# Patient Record
Sex: Female | Born: 1960 | Race: White | Hispanic: No | Marital: Married | Smoking: Never smoker
Health system: Southern US, Community
[De-identification: ages and names within clinical notes are randomized; demographics above are authoritative.]

## PROBLEM LIST (undated history)

## (undated) DIAGNOSIS — Z9289 Personal history of other medical treatment: Secondary | ICD-10-CM

## (undated) DIAGNOSIS — N179 Acute kidney failure, unspecified: Secondary | ICD-10-CM

## (undated) DIAGNOSIS — M14679 Charcot's joint, unspecified ankle and foot: Secondary | ICD-10-CM

## (undated) DIAGNOSIS — F329 Major depressive disorder, single episode, unspecified: Secondary | ICD-10-CM

## (undated) DIAGNOSIS — F32A Depression, unspecified: Secondary | ICD-10-CM

## (undated) DIAGNOSIS — K219 Gastro-esophageal reflux disease without esophagitis: Secondary | ICD-10-CM

## (undated) DIAGNOSIS — I10 Essential (primary) hypertension: Secondary | ICD-10-CM

## (undated) DIAGNOSIS — R0602 Shortness of breath: Secondary | ICD-10-CM

## (undated) DIAGNOSIS — M797 Fibromyalgia: Secondary | ICD-10-CM

## (undated) DIAGNOSIS — J189 Pneumonia, unspecified organism: Secondary | ICD-10-CM

## (undated) DIAGNOSIS — E119 Type 2 diabetes mellitus without complications: Secondary | ICD-10-CM

## (undated) DIAGNOSIS — Z8614 Personal history of Methicillin resistant Staphylococcus aureus infection: Secondary | ICD-10-CM

---

## 1996-07-16 HISTORY — PX: TUBAL LIGATION: SHX77

## 2008-07-16 HISTORY — PX: TOE AMPUTATION: SHX809

## 2010-07-16 DIAGNOSIS — J189 Pneumonia, unspecified organism: Secondary | ICD-10-CM

## 2010-07-16 DIAGNOSIS — Z9289 Personal history of other medical treatment: Secondary | ICD-10-CM

## 2010-07-16 DIAGNOSIS — N179 Acute kidney failure, unspecified: Secondary | ICD-10-CM

## 2010-07-16 HISTORY — PX: BELOW KNEE LEG AMPUTATION: SUR23

## 2010-07-16 HISTORY — DX: Pneumonia, unspecified organism: J18.9

## 2010-07-16 HISTORY — DX: Personal history of other medical treatment: Z92.89

## 2010-07-16 HISTORY — PX: CARDIAC CATHETERIZATION: SHX172

## 2010-07-16 HISTORY — DX: Acute kidney failure, unspecified: N17.9

## 2013-06-17 ENCOUNTER — Encounter (INDEPENDENT_AMBULATORY_CARE_PROVIDER_SITE_OTHER): Payer: Medicare Other | Admitting: Ophthalmology

## 2013-06-17 DIAGNOSIS — E11359 Type 2 diabetes mellitus with proliferative diabetic retinopathy without macular edema: Secondary | ICD-10-CM

## 2013-06-17 DIAGNOSIS — H35039 Hypertensive retinopathy, unspecified eye: Secondary | ICD-10-CM

## 2013-06-17 DIAGNOSIS — I10 Essential (primary) hypertension: Secondary | ICD-10-CM

## 2013-06-17 DIAGNOSIS — E1039 Type 1 diabetes mellitus with other diabetic ophthalmic complication: Secondary | ICD-10-CM

## 2013-06-17 DIAGNOSIS — H2510 Age-related nuclear cataract, unspecified eye: Secondary | ICD-10-CM

## 2013-06-17 DIAGNOSIS — H43819 Vitreous degeneration, unspecified eye: Secondary | ICD-10-CM

## 2013-06-17 DIAGNOSIS — H4310 Vitreous hemorrhage, unspecified eye: Secondary | ICD-10-CM

## 2013-06-18 DIAGNOSIS — E113519 Type 2 diabetes mellitus with proliferative diabetic retinopathy with macular edema, unspecified eye: Secondary | ICD-10-CM | POA: Diagnosis present

## 2013-06-18 DIAGNOSIS — H3342 Traction detachment of retina, left eye: Secondary | ICD-10-CM | POA: Diagnosis present

## 2013-06-18 DIAGNOSIS — H35059 Retinal neovascularization, unspecified, unspecified eye: Secondary | ICD-10-CM | POA: Diagnosis present

## 2013-06-18 NOTE — H&P (Signed)
Danielle Ingram is an 52 y.o. female.   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  No past surgical history on file.  No family history on file. Social History:  has no tobacco, alcohol, and drug history on file.  Allergies: Allergies not on file  No prescriptions prior to admission    Review of systems otherwise negative  There were no vitals taken for this visit.  Physical exam: Mental status: oriented x3. Eyes: See eye exam associated with this date of surgery in media tab.  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

## 2013-06-25 ENCOUNTER — Encounter (HOSPITAL_COMMUNITY): Payer: Self-pay | Admitting: Pharmacy Technician

## 2013-06-29 ENCOUNTER — Encounter (HOSPITAL_COMMUNITY): Payer: Self-pay | Admitting: *Deleted

## 2013-06-29 MED ORDER — CEFAZOLIN SODIUM-DEXTROSE 2-3 GM-% IV SOLR
2.0000 g | INTRAVENOUS | Status: AC
  Administered 2013-06-30: 2 g via INTRAVENOUS
  Filled 2013-06-29: qty 50

## 2013-06-29 NOTE — Progress Notes (Signed)
Patient reported that she had An EKG done at [facility] in [place]. 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  I sent a request to the hospital and her Medical MD.  Pt states that she does not see a cardiologist.

## 2013-06-30 ENCOUNTER — Encounter (HOSPITAL_COMMUNITY)
Admission: RE | Disposition: A | Discharge status: Home / Self Care | Payer: Self-pay | Source: Ambulatory Visit | Attending: Ophthalmology

## 2013-06-30 ENCOUNTER — Ambulatory Visit (HOSPITAL_COMMUNITY): Payer: Medicare Other | Admitting: Anesthesiology

## 2013-06-30 ENCOUNTER — Encounter (HOSPITAL_COMMUNITY): Payer: Self-pay | Admitting: *Deleted

## 2013-06-30 ENCOUNTER — Ambulatory Visit (HOSPITAL_COMMUNITY)
Admission: RE | Admit: 2013-06-30 | Discharge: 2013-07-01 | Disposition: A | Discharge status: Home / Self Care | Payer: Medicare Other | Source: Ambulatory Visit | Attending: Ophthalmology | Admitting: Ophthalmology

## 2013-06-30 ENCOUNTER — Ambulatory Visit (HOSPITAL_COMMUNITY): Payer: Medicare Other

## 2013-06-30 ENCOUNTER — Encounter (HOSPITAL_COMMUNITY): Payer: Medicare Other | Admitting: Anesthesiology

## 2013-06-30 DIAGNOSIS — F3289 Other specified depressive episodes: Secondary | ICD-10-CM | POA: Insufficient documentation

## 2013-06-30 DIAGNOSIS — E113519 Type 2 diabetes mellitus with proliferative diabetic retinopathy with macular edema, unspecified eye: Secondary | ICD-10-CM | POA: Diagnosis present

## 2013-06-30 DIAGNOSIS — H3342 Traction detachment of retina, left eye: Secondary | ICD-10-CM | POA: Diagnosis present

## 2013-06-30 DIAGNOSIS — I10 Essential (primary) hypertension: Secondary | ICD-10-CM | POA: Insufficient documentation

## 2013-06-30 DIAGNOSIS — E1139 Type 2 diabetes mellitus with other diabetic ophthalmic complication: Secondary | ICD-10-CM | POA: Insufficient documentation

## 2013-06-30 DIAGNOSIS — H269 Unspecified cataract: Secondary | ICD-10-CM | POA: Insufficient documentation

## 2013-06-30 DIAGNOSIS — K219 Gastro-esophageal reflux disease without esophagitis: Secondary | ICD-10-CM | POA: Insufficient documentation

## 2013-06-30 DIAGNOSIS — R0602 Shortness of breath: Secondary | ICD-10-CM | POA: Insufficient documentation

## 2013-06-30 DIAGNOSIS — N289 Disorder of kidney and ureter, unspecified: Secondary | ICD-10-CM | POA: Insufficient documentation

## 2013-06-30 DIAGNOSIS — E11359 Type 2 diabetes mellitus with proliferative diabetic retinopathy without macular edema: Secondary | ICD-10-CM | POA: Insufficient documentation

## 2013-06-30 DIAGNOSIS — H4310 Vitreous hemorrhage, unspecified eye: Secondary | ICD-10-CM | POA: Insufficient documentation

## 2013-06-30 DIAGNOSIS — F329 Major depressive disorder, single episode, unspecified: Secondary | ICD-10-CM | POA: Insufficient documentation

## 2013-06-30 DIAGNOSIS — E1039 Type 1 diabetes mellitus with other diabetic ophthalmic complication: Secondary | ICD-10-CM

## 2013-06-30 DIAGNOSIS — H3340 Traction detachment of retina, unspecified eye: Secondary | ICD-10-CM

## 2013-06-30 DIAGNOSIS — E1065 Type 1 diabetes mellitus with hyperglycemia: Secondary | ICD-10-CM

## 2013-06-30 DIAGNOSIS — Z794 Long term (current) use of insulin: Secondary | ICD-10-CM | POA: Insufficient documentation

## 2013-06-30 DIAGNOSIS — H35059 Retinal neovascularization, unspecified, unspecified eye: Secondary | ICD-10-CM | POA: Diagnosis present

## 2013-06-30 HISTORY — DX: Depression, unspecified: F32.A

## 2013-06-30 HISTORY — DX: Type 2 diabetes mellitus without complications: E11.9

## 2013-06-30 HISTORY — PX: MEMBRANE PEEL: SHX5967

## 2013-06-30 HISTORY — DX: Pneumonia, unspecified organism: J18.9

## 2013-06-30 HISTORY — DX: Essential (primary) hypertension: I10

## 2013-06-30 HISTORY — DX: Major depressive disorder, single episode, unspecified: F32.9

## 2013-06-30 HISTORY — DX: Gastro-esophageal reflux disease without esophagitis: K21.9

## 2013-06-30 HISTORY — DX: Personal history of other medical treatment: Z92.89

## 2013-06-30 HISTORY — PX: GAS/FLUID EXCHANGE: SHX5334

## 2013-06-30 HISTORY — PX: PHOTOCOAGULATION WITH LASER: SHX6027

## 2013-06-30 HISTORY — DX: Charcot's joint, unspecified ankle and foot: M14.679

## 2013-06-30 HISTORY — DX: Acute kidney failure, unspecified: N17.9

## 2013-06-30 HISTORY — PX: PARS PLANA VITRECTOMY: SHX2166

## 2013-06-30 HISTORY — DX: Fibromyalgia: M79.7

## 2013-06-30 HISTORY — DX: Shortness of breath: R06.02

## 2013-06-30 LAB — GLUCOSE, CAPILLARY
Glucose-Capillary: 220 mg/dL — ABNORMAL HIGH (ref 70–99)
Glucose-Capillary: 240 mg/dL — ABNORMAL HIGH (ref 70–99)
Glucose-Capillary: 263 mg/dL — ABNORMAL HIGH (ref 70–99)
Glucose-Capillary: 271 mg/dL — ABNORMAL HIGH (ref 70–99)
Glucose-Capillary: 372 mg/dL — ABNORMAL HIGH (ref 70–99)
Glucose-Capillary: 404 mg/dL — ABNORMAL HIGH (ref 70–99)

## 2013-06-30 LAB — HCG, SERUM, QUALITATIVE: Preg, Serum: NEGATIVE

## 2013-06-30 LAB — CBC
Hemoglobin: 13.4 g/dL (ref 12.0–15.0)
MCH: 26.1 pg (ref 26.0–34.0)
MCHC: 31.5 g/dL (ref 30.0–36.0)
Platelets: 310 10*3/uL (ref 150–400)
RDW: 14.6 % (ref 11.5–15.5)

## 2013-06-30 LAB — BASIC METABOLIC PANEL
BUN: 16 mg/dL (ref 6–23)
Calcium: 9.9 mg/dL (ref 8.4–10.5)
Creatinine, Ser: 0.96 mg/dL (ref 0.50–1.10)
GFR calc Af Amer: 79 mL/min — ABNORMAL LOW (ref >90)
GFR calc non Af Amer: 68 mL/min — ABNORMAL LOW (ref >90)
Glucose, Bld: 304 mg/dL — ABNORMAL HIGH (ref 70–99)

## 2013-06-30 SURGERY — PARS PLANA VITRECTOMY WITH 25 GAUGE
Anesthesia: General | Site: Eye | Laterality: Left

## 2013-06-30 MED ORDER — ACETAZOLAMIDE SODIUM 500 MG IJ SOLR
INTRAMUSCULAR | Status: AC
  Filled 2013-06-30: qty 500

## 2013-06-30 MED ORDER — ONDANSETRON HCL 4 MG/2ML IJ SOLN
INTRAMUSCULAR | Status: DC | PRN
  Administered 2013-06-30: 4 mg via INTRAVENOUS

## 2013-06-30 MED ORDER — MIDAZOLAM HCL 5 MG/5ML IJ SOLN
INTRAMUSCULAR | Status: DC | PRN
  Administered 2013-06-30: 2 mg via INTRAVENOUS

## 2013-06-30 MED ORDER — FENTANYL CITRATE 0.05 MG/ML IJ SOLN
INTRAMUSCULAR | Status: DC | PRN
  Administered 2013-06-30: 100 ug via INTRAVENOUS
  Administered 2013-06-30: 50 ug via INTRAVENOUS

## 2013-06-30 MED ORDER — TRIAMCINOLONE ACETONIDE 40 MG/ML IJ SUSP
INTRAMUSCULAR | Status: AC
  Filled 2013-06-30: qty 5

## 2013-06-30 MED ORDER — HYDROMORPHONE HCL PF 1 MG/ML IJ SOLN
INTRAMUSCULAR | Status: AC
  Filled 2013-06-30: qty 1

## 2013-06-30 MED ORDER — CYCLOPENTOLATE HCL 1 % OP SOLN
1.0000 [drp] | OPHTHALMIC | Status: AC | PRN
  Administered 2013-06-30 (×3): 1 [drp] via OPHTHALMIC
  Filled 2013-06-30: qty 2

## 2013-06-30 MED ORDER — SODIUM CHLORIDE 0.45 % IV SOLN
INTRAVENOUS | Status: DC
  Administered 2013-06-30: 20 mL/h via INTRAVENOUS

## 2013-06-30 MED ORDER — HYDROMORPHONE HCL PF 1 MG/ML IJ SOLN
0.2500 mg | INTRAMUSCULAR | Status: DC | PRN
  Administered 2013-06-30: 0.25 mg via INTRAVENOUS

## 2013-06-30 MED ORDER — FLUCONAZOLE 100 MG PO TABS
100.0000 mg | ORAL_TABLET | Freq: Every day | ORAL | Status: DC
  Administered 2013-06-30: 100 mg via ORAL
  Filled 2013-06-30 (×5): qty 1

## 2013-06-30 MED ORDER — TROPICAMIDE 1 % OP SOLN
OPHTHALMIC | Status: AC
  Administered 2013-06-30: 1 [drp]
  Filled 2013-06-30: qty 3

## 2013-06-30 MED ORDER — TRAZODONE HCL 100 MG PO TABS
100.0000 mg | ORAL_TABLET | Freq: Every day | ORAL | Status: DC
  Administered 2013-06-30: 100 mg via ORAL
  Filled 2013-06-30 (×3): qty 1

## 2013-06-30 MED ORDER — DIFLUPREDNATE 0.05 % OP EMUL: 1.0000 [drp] | Freq: Every day | OPHTHALMIC | Status: DC

## 2013-06-30 MED ORDER — DEXAMETHASONE SODIUM PHOSPHATE 10 MG/ML IJ SOLN
INTRAMUSCULAR | Status: DC | PRN
  Administered 2013-06-30: 10 mg

## 2013-06-30 MED ORDER — MORPHINE SULFATE 2 MG/ML IJ SOLN: 1.0000 mg | INTRAMUSCULAR | Status: DC | PRN

## 2013-06-30 MED ORDER — NEOSTIGMINE METHYLSULFATE 1 MG/ML IJ SOLN
INTRAMUSCULAR | Status: DC | PRN
  Administered 2013-06-30: 3 mg via INTRAVENOUS

## 2013-06-30 MED ORDER — HYDROCHLOROTHIAZIDE 12.5 MG PO CAPS
12.5000 mg | ORAL_CAPSULE | Freq: Every day | ORAL | Status: DC
  Administered 2013-06-30: 12.5 mg via ORAL
  Filled 2013-06-30 (×2): qty 1

## 2013-06-30 MED ORDER — ATROPINE SULFATE 1 % OP SOLN
OPHTHALMIC | Status: AC
  Filled 2013-06-30: qty 2

## 2013-06-30 MED ORDER — HYPROMELLOSE (GONIOSCOPIC) 2.5 % OP SOLN
OPHTHALMIC | Status: AC
  Filled 2013-06-30: qty 15

## 2013-06-30 MED ORDER — ATROPINE SULFATE 1 % OP SOLN
OPHTHALMIC | Status: DC | PRN
  Administered 2013-06-30: 2 [drp] via OPHTHALMIC

## 2013-06-30 MED ORDER — TEMAZEPAM 15 MG PO CAPS: 15.0000 mg | ORAL_CAPSULE | Freq: Every evening | ORAL | Status: DC | PRN

## 2013-06-30 MED ORDER — ACETAZOLAMIDE SODIUM 500 MG IJ SOLR
500.0000 mg | Freq: Once | INTRAMUSCULAR | Status: AC
  Administered 2013-07-01: 500 mg via INTRAVENOUS
  Filled 2013-06-30: qty 500

## 2013-06-30 MED ORDER — FENTANYL 50 MCG/HR TD PT72
150.0000 ug | MEDICATED_PATCH | TRANSDERMAL | Status: DC
  Administered 2013-06-30: 150 ug via TRANSDERMAL
  Filled 2013-06-30: qty 3

## 2013-06-30 MED ORDER — LISINOPRIL-HYDROCHLOROTHIAZIDE 10-12.5 MG PO TABS: 1.0000 | ORAL_TABLET | Freq: Every day | ORAL | Status: DC

## 2013-06-30 MED ORDER — ONDANSETRON HCL 4 MG/2ML IJ SOLN: 4.0000 mg | Freq: Four times a day (QID) | INTRAMUSCULAR | Status: DC | PRN

## 2013-06-30 MED ORDER — PHENYLEPHRINE HCL 10 MG/ML IJ SOLN
INTRAMUSCULAR | Status: DC | PRN
  Administered 2013-06-30: 40 ug via INTRAVENOUS

## 2013-06-30 MED ORDER — LISINOPRIL 10 MG PO TABS
10.0000 mg | ORAL_TABLET | Freq: Every day | ORAL | Status: DC
  Administered 2013-06-30: 10 mg via ORAL
  Filled 2013-06-30 (×2): qty 1

## 2013-06-30 MED ORDER — HYDROCODONE-ACETAMINOPHEN 5-325 MG PO TABS
1.0000 | ORAL_TABLET | ORAL | Status: DC | PRN
  Administered 2013-06-30 – 2013-07-01 (×2): 2 via ORAL
  Filled 2013-06-30 (×2): qty 2

## 2013-06-30 MED ORDER — GLYCOPYRROLATE 0.2 MG/ML IJ SOLN
INTRAMUSCULAR | Status: DC | PRN
  Administered 2013-06-30: 0.4 mg via INTRAVENOUS

## 2013-06-30 MED ORDER — GATIFLOXACIN 0.5 % OP SOLN
1.0000 [drp] | Freq: Four times a day (QID) | OPHTHALMIC | Status: DC
  Filled 2013-06-30: qty 2.5

## 2013-06-30 MED ORDER — MINERAL OIL LIGHT 100 % EX OIL
TOPICAL_OIL | CUTANEOUS | Status: AC
  Filled 2013-06-30: qty 25

## 2013-06-30 MED ORDER — LATANOPROST 0.005 % OP SOLN
1.0000 [drp] | Freq: Every day | OPHTHALMIC | Status: DC
  Filled 2013-06-30: qty 2.5

## 2013-06-30 MED ORDER — EPINEPHRINE HCL 1 MG/ML IJ SOLN
INTRAMUSCULAR | Status: AC
  Filled 2013-06-30: qty 1

## 2013-06-30 MED ORDER — EPINEPHRINE HCL 1 MG/ML IJ SOLN
INTRAOCULAR | Status: DC | PRN
  Administered 2013-06-30: 11:00:00

## 2013-06-30 MED ORDER — ACETAMINOPHEN 325 MG PO TABS: 325.0000 mg | ORAL_TABLET | ORAL | Status: DC | PRN

## 2013-06-30 MED ORDER — LIDOCAINE HCL (CARDIAC) 20 MG/ML IV SOLN
INTRAVENOUS | Status: DC | PRN
  Administered 2013-06-30: 60 mg via INTRAVENOUS

## 2013-06-30 MED ORDER — SODIUM CHLORIDE 0.9 % IJ SOLN
INTRAMUSCULAR | Status: DC | PRN
  Administered 2013-06-30: 11:00:00

## 2013-06-30 MED ORDER — SODIUM CHLORIDE 0.9 % IJ SOLN
INTRAMUSCULAR | Status: AC
  Filled 2013-06-30: qty 10

## 2013-06-30 MED ORDER — GENTAMICIN SULFATE 40 MG/ML IJ SOLN
INTRAMUSCULAR | Status: AC
  Filled 2013-06-30: qty 2

## 2013-06-30 MED ORDER — BUPIVACAINE HCL (PF) 0.75 % IJ SOLN
INTRAMUSCULAR | Status: DC | PRN
  Administered 2013-06-30: 10 mL

## 2013-06-30 MED ORDER — BUPIVACAINE-EPINEPHRINE (PF) 0.25% -1:200000 IJ SOLN
INTRAMUSCULAR | Status: AC
  Filled 2013-06-30: qty 30

## 2013-06-30 MED ORDER — SODIUM CHLORIDE 0.9 % IV SOLN
INTRAVENOUS | Status: DC
  Administered 2013-06-30 (×2): via INTRAVENOUS

## 2013-06-30 MED ORDER — LIDOCAINE HCL 2 % IJ SOLN
INTRAMUSCULAR | Status: AC
  Filled 2013-06-30: qty 20

## 2013-06-30 MED ORDER — BRIMONIDINE TARTRATE 0.2 % OP SOLN
1.0000 [drp] | Freq: Two times a day (BID) | OPHTHALMIC | Status: DC
  Filled 2013-06-30: qty 5

## 2013-06-30 MED ORDER — HEMOSTATIC AGENTS (NO CHARGE) OPTIME
TOPICAL | Status: DC | PRN
  Administered 2013-06-30: 1 via TOPICAL

## 2013-06-30 MED ORDER — MAGNESIUM HYDROXIDE 400 MG/5ML PO SUSP: 15.0000 mL | Freq: Four times a day (QID) | ORAL | Status: DC | PRN

## 2013-06-30 MED ORDER — BACITRACIN-POLYMYXIN B 500-10000 UNIT/GM OP OINT
TOPICAL_OINTMENT | OPHTHALMIC | Status: DC | PRN
  Administered 2013-06-30: 1 via OPHTHALMIC

## 2013-06-30 MED ORDER — ROCURONIUM BROMIDE 100 MG/10ML IV SOLN
INTRAVENOUS | Status: DC | PRN
  Administered 2013-06-30: 35 mg via INTRAVENOUS

## 2013-06-30 MED ORDER — DOCUSATE SODIUM 100 MG PO CAPS
100.0000 mg | ORAL_CAPSULE | Freq: Two times a day (BID) | ORAL | Status: DC
  Administered 2013-06-30: 100 mg via ORAL
  Filled 2013-06-30: qty 1

## 2013-06-30 MED ORDER — HYALURONIDASE HUMAN 150 UNIT/ML IJ SOLN
INTRAMUSCULAR | Status: AC
  Filled 2013-06-30: qty 1

## 2013-06-30 MED ORDER — PREDNISOLONE ACETATE 1 % OP SUSP
1.0000 [drp] | Freq: Four times a day (QID) | OPHTHALMIC | Status: DC
  Filled 2013-06-30: qty 1

## 2013-06-30 MED ORDER — BACITRACIN-POLYMYXIN B 500-10000 UNIT/GM OP OINT
1.0000 "application " | TOPICAL_OINTMENT | Freq: Four times a day (QID) | OPHTHALMIC | Status: DC
  Filled 2013-06-30: qty 3.5

## 2013-06-30 MED ORDER — INSULIN DETEMIR 100 UNIT/ML ~~LOC~~ SOLN
45.0000 [IU] | Freq: Every day | SUBCUTANEOUS | Status: DC
  Administered 2013-06-30: 45 [IU] via SUBCUTANEOUS
  Filled 2013-06-30: qty 0.45

## 2013-06-30 MED ORDER — PROPOFOL 10 MG/ML IV BOLUS
INTRAVENOUS | Status: DC | PRN
  Administered 2013-06-30: 180 mg via INTRAVENOUS

## 2013-06-30 MED ORDER — GATIFLOXACIN 0.5 % OP SOLN
1.0000 [drp] | OPHTHALMIC | Status: AC | PRN
  Administered 2013-06-30 (×3): 1 [drp] via OPHTHALMIC
  Filled 2013-06-30: qty 2.5

## 2013-06-30 MED ORDER — PANTOPRAZOLE SODIUM 40 MG PO TBEC
40.0000 mg | DELAYED_RELEASE_TABLET | Freq: Every day | ORAL | Status: DC
  Administered 2013-06-30: 40 mg via ORAL
  Filled 2013-06-30: qty 1

## 2013-06-30 MED ORDER — BSS IO SOLN
INTRAOCULAR | Status: AC
  Filled 2013-06-30: qty 15

## 2013-06-30 MED ORDER — INSULIN ASPART 100 UNIT/ML ~~LOC~~ SOLN
12.0000 [IU] | Freq: Three times a day (TID) | SUBCUTANEOUS | Status: DC
  Administered 2013-06-30: 12 [IU] via SUBCUTANEOUS

## 2013-06-30 MED ORDER — TROPICAMIDE 1 % OP SOLN
1.0000 [drp] | OPHTHALMIC | Status: AC | PRN
  Administered 2013-06-30 (×3): 1 [drp] via OPHTHALMIC

## 2013-06-30 MED ORDER — BSS PLUS IO SOLN
INTRAOCULAR | Status: AC
  Filled 2013-06-30: qty 500

## 2013-06-30 MED ORDER — SODIUM HYALURONATE 10 MG/ML IO SOLN
INTRAOCULAR | Status: AC
  Filled 2013-06-30: qty 0.85

## 2013-06-30 MED ORDER — BUPIVACAINE HCL (PF) 0.75 % IJ SOLN
INTRAMUSCULAR | Status: AC
  Filled 2013-06-30: qty 10

## 2013-06-30 MED ORDER — SODIUM HYALURONATE 10 MG/ML IO SOLN
INTRAOCULAR | Status: DC | PRN
  Administered 2013-06-30: 0.85 mL via INTRAOCULAR

## 2013-06-30 MED ORDER — BACITRACIN-POLYMYXIN B 500-10000 UNIT/GM OP OINT
TOPICAL_OINTMENT | OPHTHALMIC | Status: AC
  Filled 2013-06-30: qty 3.5

## 2013-06-30 MED ORDER — GABAPENTIN 300 MG PO CAPS
900.0000 mg | ORAL_CAPSULE | Freq: Three times a day (TID) | ORAL | Status: DC
  Administered 2013-06-30 (×2): 900 mg via ORAL
  Filled 2013-06-30 (×5): qty 3

## 2013-06-30 MED ORDER — PHENYLEPHRINE HCL 2.5 % OP SOLN
1.0000 [drp] | OPHTHALMIC | Status: AC | PRN
  Administered 2013-06-30 (×3): 1 [drp] via OPHTHALMIC
  Filled 2013-06-30: qty 15

## 2013-06-30 MED ORDER — TETRACAINE HCL 0.5 % OP SOLN
2.0000 [drp] | Freq: Once | OPHTHALMIC | Status: DC
  Filled 2013-06-30: qty 2

## 2013-06-30 MED ORDER — DEXAMETHASONE SODIUM PHOSPHATE 10 MG/ML IJ SOLN
INTRAMUSCULAR | Status: AC
  Filled 2013-06-30: qty 1

## 2013-06-30 MED ORDER — POLYMYXIN B SULFATE 500000 UNITS IJ SOLR
INTRAMUSCULAR | Status: AC
  Filled 2013-06-30: qty 1

## 2013-06-30 SURGICAL SUPPLY — 67 items
APPLICATOR DR MATTHEWS STRL (MISCELLANEOUS) IMPLANT
BLADE EYE CATARACT 19 1.4 BEAV (BLADE) IMPLANT
BLADE MVR KNIFE 19G (BLADE) IMPLANT
BLADE MVR KNIFE 20G (BLADE) IMPLANT
CANNULA DUAL BORE 23G (CANNULA) IMPLANT
CANNULA VLV SOFT TIP 25GA (OPHTHALMIC) ×2 IMPLANT
CORDS BIPOLAR (ELECTRODE) ×2 IMPLANT
COTTONBALL LRG STERILE PKG (GAUZE/BANDAGES/DRESSINGS) ×6 IMPLANT
COVER MAYO STAND STRL (DRAPES) ×2 IMPLANT
DRAPE INCISE 51X51 W/FILM STRL (DRAPES) ×2 IMPLANT
DRAPE OPHTHALMIC 77X100 STRL (CUSTOM PROCEDURE TRAY) ×2 IMPLANT
FILTER BLUE MILLIPORE (MISCELLANEOUS) IMPLANT
FORCEPS ECKARDT ILM 25G SERR (OPHTHALMIC RELATED) IMPLANT
FORCEPS GRIESHABER ILM 25G A (INSTRUMENTS) ×2 IMPLANT
GLOVE BIOGEL PI IND STRL 6.5 (GLOVE) ×1 IMPLANT
GLOVE BIOGEL PI IND STRL 7.0 (GLOVE) ×1 IMPLANT
GLOVE BIOGEL PI INDICATOR 6.5 (GLOVE) ×1
GLOVE BIOGEL PI INDICATOR 7.0 (GLOVE) ×1
GLOVE SS BIOGEL STRL SZ 6.5 (GLOVE) ×1 IMPLANT
GLOVE SS BIOGEL STRL SZ 7 (GLOVE) ×1 IMPLANT
GLOVE SUPERSENSE BIOGEL SZ 6.5 (GLOVE) ×1
GLOVE SUPERSENSE BIOGEL SZ 7 (GLOVE) ×1
GLOVE SURG 8.5 LATEX PF (GLOVE) ×2 IMPLANT
GLOVE SURG SS PI 6.5 STRL IVOR (GLOVE) ×2 IMPLANT
GOWN STRL NON-REIN LRG LVL3 (GOWN DISPOSABLE) ×8 IMPLANT
HANDLE PNEUMATIC FOR CONSTEL (OPHTHALMIC) ×2 IMPLANT
KIT BASIN OR (CUSTOM PROCEDURE TRAY) ×2 IMPLANT
KNIFE CRESCENT 2.5 55 ANG (BLADE) IMPLANT
MASK EYE SHIELD (GAUZE/BANDAGES/DRESSINGS) ×2 IMPLANT
MICROPICK 25G (MISCELLANEOUS)
NEEDLE 18GX1X1/2 (RX/OR ONLY) (NEEDLE) ×2 IMPLANT
NEEDLE 25GX 5/8IN NON SAFETY (NEEDLE) ×2 IMPLANT
NEEDLE 27GAX1X1/2 (NEEDLE) IMPLANT
NEEDLE FILTER BLUNT 18X 1/2SAF (NEEDLE) ×1
NEEDLE FILTER BLUNT 18X1 1/2 (NEEDLE) ×1 IMPLANT
NEEDLE HYPO 30X.5 LL (NEEDLE) ×4 IMPLANT
NS IRRIG 1000ML POUR BTL (IV SOLUTION) ×2 IMPLANT
PACK VITRECTOMY CUSTOM (CUSTOM PROCEDURE TRAY) ×2 IMPLANT
PAD ARMBOARD 7.5X6 YLW CONV (MISCELLANEOUS) ×4 IMPLANT
PAD EYE OVAL STERILE LF (GAUZE/BANDAGES/DRESSINGS) ×2 IMPLANT
PAK PIK VITRECTOMY CVS 25GA (OPHTHALMIC) ×2 IMPLANT
PENCIL BIPOLAR 25GA STR DISP (OPHTHALMIC RELATED) ×2 IMPLANT
PIC ILLUMINATED 25G (OPHTHALMIC) ×2
PICK MICROPICK 25G (MISCELLANEOUS) IMPLANT
PIK ILLUMINATED 25G (OPHTHALMIC) ×1 IMPLANT
PROBE LASER ILLUM FLEX CVD 25G (OPHTHALMIC) ×2 IMPLANT
REPL STRA BRUSH NEEDLE (NEEDLE) IMPLANT
RESERVOIR BACK FLUSH (MISCELLANEOUS) IMPLANT
ROLLS DENTAL (MISCELLANEOUS) ×4 IMPLANT
SCISSORS TIP ADVANCED DSP 25GA (INSTRUMENTS) IMPLANT
SCRAPER DIAMOND DUST MEMBRANE (MISCELLANEOUS) IMPLANT
SPONGE SURGIFOAM ABS GEL 12-7 (HEMOSTASIS) ×2 IMPLANT
STOPCOCK 4 WAY LG BORE MALE ST (IV SETS) IMPLANT
SUT CHROMIC 7 0 TG140 8 (SUTURE) IMPLANT
SUT ETHILON 10 0 CS140 6 (SUTURE) IMPLANT
SUT ETHILON 9 0 TG140 8 (SUTURE) IMPLANT
SUT POLY NON ABSORB 10-0 8 STR (SUTURE) IMPLANT
SYR 20CC LL (SYRINGE) ×2 IMPLANT
SYR 5ML LL (SYRINGE) IMPLANT
SYR BULB 3OZ (MISCELLANEOUS) ×2 IMPLANT
SYR TB 1ML LUER SLIP (SYRINGE) ×2 IMPLANT
TAPE SURG TRANSPORE 1 IN (GAUZE/BANDAGES/DRESSINGS) ×1 IMPLANT
TAPE SURGICAL TRANSPORE 1 IN (GAUZE/BANDAGES/DRESSINGS) ×1
TOWEL OR 17X24 6PK STRL BLUE (TOWEL DISPOSABLE) IMPLANT
TOWEL OR 17X26 10 PK STRL BLUE (TOWEL DISPOSABLE) ×2 IMPLANT
WATER STERILE IRR 1000ML POUR (IV SOLUTION) ×2 IMPLANT
WIPE INSTRUMENT VISIWIPE 73X73 (MISCELLANEOUS) ×2 IMPLANT

## 2013-06-30 NOTE — H&P (Signed)
I examined the patient today and there is no change in the medical status 

## 2013-06-30 NOTE — Anesthesia Procedure Notes (Signed)
Procedure Name: Intubation Date/Time: 06/30/2013 11:40 AM Performed by: Lovie Chol Pre-anesthesia Checklist: Patient identified, Emergency Drugs available, Suction available, Patient being monitored and Timeout performed Patient Re-evaluated:Patient Re-evaluated prior to inductionOxygen Delivery Method: Circle system utilized Preoxygenation: Pre-oxygenation with 100% oxygen Intubation Type: IV induction Ventilation: Mask ventilation without difficulty Laryngoscope Size: Miller and 2 Grade View: Grade I Tube type: Oral Tube size: 7.0 mm Number of attempts: 1 Airway Equipment and Method: Stylet Placement Confirmation: ETT inserted through vocal cords under direct vision,  positive ETCO2,  CO2 detector and breath sounds checked- equal and bilateral Secured at: 22 cm Tube secured with: Tape Dental Injury: Teeth and Oropharynx as per pre-operative assessment

## 2013-06-30 NOTE — Brief Op Note (Signed)
Brief Operative note   Preoperative diagnosis:  Pre-Op Diagnosis Codes:    * Vitreous hemorrhage, left [379.23] Postoperative diagnosis  Post-Op Diagnosis Codes:    * Vitreous hemorrhage, left [379.23] Traction retinal detachment left eye  Procedures: Repair of complex traction retinal detachment with vitrectomy, membrane peel, laser and gas injection left eye.  Surgeon:  Sherrie George, MD...  Assistant:  Rosalie Doctor SA    Anesthesia: General  Specimen: none  Estimated blood loss:  1cc  Complications: none  Patient sent to PACU in good condition  Composed by Sherrie George MD  Dictation number: [phone]

## 2013-06-30 NOTE — Anesthesia Postprocedure Evaluation (Signed)
  Anesthesia Post-op Note  Patient: Danielle Ingram  Procedure(s) Performed: Procedure(s) with comments: PARS PLANA VITRECTOMY WITH 25 GAUGE (Left) - COMPLEX REPAIR OF TRACTION RETINAL DETACHMENT MEMBRANE PEEL (Left) ENDOLASER (Left) AIR/FLUID EXCHANGE (Left)  Patient Location: PACU  Anesthesia Type:General  Level of Consciousness: awake  Airway and Oxygen Therapy: Patient Spontanous Breathing  Post-op Pain: mild  Post-op Assessment: Post-op Vital signs reviewed  Post-op Vital Signs: Reviewed  Complications: No apparent anesthesia complications 

## 2013-06-30 NOTE — Transfer of Care (Signed)
Immediate Anesthesia Transfer of Care Note  Patient: Danielle Ingram  Procedure(s) Performed: Procedure(s) with comments: PARS PLANA VITRECTOMY WITH 25 GAUGE (Left) - COMPLEX REPAIR OF TRACTION RETINAL DETACHMENT MEMBRANE PEEL (Left) ENDOLASER (Left) AIR/FLUID EXCHANGE (Left)  Patient Location: PACU  Anesthesia Type:General  Level of Consciousness: awake, oriented and patient cooperative  Airway & Oxygen Therapy: Patient Spontanous Breathing and Patient connected to nasal cannula oxygen  Post-op Assessment: Report given to PACU RN and Post -op Vital signs reviewed and stable  Post vital signs: Reviewed  Complications: No apparent anesthesia complications

## 2013-06-30 NOTE — OR Nursing (Signed)
15mL bottle of balanced salt solution came in the vitrectomy supply pack opened for the 25 gauge vitrectomy - left eye procedure by Beulah Gandy. Ashley Royalty, MD. It was opened to the sterile field. It expires 05/2016.  Oralia Manis, RN

## 2013-06-30 NOTE — Anesthesia Preprocedure Evaluation (Addendum)
Anesthesia Evaluation  Patient identified by MRN, date of birth, ID band Patient awake    Reviewed: Allergy & Precautions, H&P , NPO status , Patient's Chart, lab work & pertinent test results  History of Anesthesia Complications Negative for: history of anesthetic complications  Airway Mallampati: II TM Distance: >3 FB Neck ROM: Full    Dental  (+) Partial Upper and Dental Advisory Given   Pulmonary shortness of breath and with exertion,  breath sounds clear to auscultation        Cardiovascular hypertension, Pt. on medications Rhythm:Regular Rate:Normal     Neuro/Psych PSYCHIATRIC DISORDERS Depression negative neurological ROS     GI/Hepatic Neg liver ROS, GERD-  Medicated and Controlled,  Endo/Other  diabetes, Type 2, Insulin Dependent  Renal/GU Renal disease     Musculoskeletal negative musculoskeletal ROS (+)   Abdominal   Peds  Hematology   Anesthesia Other Findings   Reproductive/Obstetrics negative OB ROS                        Anesthesia Physical Anesthesia Plan  ASA: III  Anesthesia Plan: General   Post-op Pain Management:    Induction: Intravenous  Airway Management Planned: Oral ETT  Additional Equipment:   Intra-op Plan:   Post-operative Plan: Extubation in OR  Informed Consent: I have reviewed the patients History and Physical, chart, labs and discussed the procedure including the risks, benefits and alternatives for the proposed anesthesia with the patient or authorized representative who has indicated his/her understanding and acceptance.   Dental advisory given  Plan Discussed with: CRNA, Anesthesiologist and Surgeon  Anesthesia Plan Comments:         Anesthesia Quick Evaluation

## 2013-06-30 NOTE — Preoperative (Signed)
Beta Blockers   Reason not to administer Beta Blockers:Not Applicable 

## 2013-06-30 NOTE — Anesthesia Postprocedure Evaluation (Signed)
  Anesthesia Post-op Note  Patient: Danielle Ingram  Procedure(s) Performed: Procedure(s) with comments: PARS PLANA VITRECTOMY WITH 25 GAUGE (Left) - COMPLEX REPAIR OF TRACTION RETINAL DETACHMENT MEMBRANE PEEL (Left) ENDOLASER (Left) AIR/FLUID EXCHANGE (Left)  Patient Location: PACU  Anesthesia Type:General  Level of Consciousness: awake  Airway and Oxygen Therapy: Patient Spontanous Breathing  Post-op Pain: mild  Post-op Assessment: Post-op Vital signs reviewed  Post-op Vital Signs: Reviewed  Complications: No apparent anesthesia complications

## 2013-07-01 LAB — GLUCOSE, CAPILLARY: Glucose-Capillary: 358 mg/dL — ABNORMAL HIGH (ref 70–99)

## 2013-07-01 MED ORDER — BACITRACIN-POLYMYXIN B 500-10000 UNIT/GM OP OINT: 1.0000 "application " | TOPICAL_OINTMENT | Freq: Four times a day (QID) | OPHTHALMIC | Status: DC

## 2013-07-01 MED ORDER — SODIUM CHLORIDE 0.9 % IV SOLN
INTRAVENOUS | Status: DC
  Administered 2013-07-01: 02:00:00 via INTRAVENOUS
  Filled 2013-07-01 (×2): qty 1

## 2013-07-01 MED ORDER — DEXTROSE-NACL 5-0.45 % IV SOLN: INTRAVENOUS | Status: DC

## 2013-07-01 MED ORDER — SODIUM CHLORIDE 0.45 % IV SOLN: INTRAVENOUS | Status: DC

## 2013-07-01 MED ORDER — GATIFLOXACIN 0.5 % OP SOLN: 1.0000 [drp] | Freq: Four times a day (QID) | OPHTHALMIC | Status: DC

## 2013-07-01 MED ORDER — INSULIN REGULAR BOLUS VIA INFUSION
0.0000 [IU] | Freq: Three times a day (TID) | INTRAVENOUS | Status: DC
  Filled 2013-07-01: qty 10

## 2013-07-01 MED ORDER — DEXTROSE 50 % IV SOLN: 25.0000 mL | INTRAVENOUS | Status: DC | PRN

## 2013-07-01 MED ORDER — PREDNISOLONE ACETATE 1 % OP SUSP: 1.0000 [drp] | Freq: Four times a day (QID) | OPHTHALMIC | Status: DC

## 2013-07-01 NOTE — Progress Notes (Signed)
1610 Glucose stabilizer d/ced per Dr. Ashley Royalty instructions. Dr. Ashley Royalty was told of last cbg 269. Dr. Molli Hazard stated that patient was going to be discharge and to dc glucose stabilizer.  [id number] Discharge instructions explain to patient and patient verbalize understanding of instructions. Patient d/c to home with  Sister.

## 2013-07-01 NOTE — Op Note (Signed)
Danielle Ingram, Danielle Ingram              ACCOUNT NO.:  [id number]  MEDICAL RECORD NO.:  [id number]  LOCATION:  6N08C                        FACILITY:  [facility]  PHYSICIAN:  Beulah Gandy. Ashley Royalty, M.D. DATE OF BIRTH:  [birth year]  DATE OF PROCEDURE:  06/30/2013 DATE OF DISCHARGE:  07/01/2013                              OPERATIVE REPORT   ADMISSION DIAGNOSES:  Traction retinal detachment, proliferative diabetic retinopathy, vitreous hemorrhage.  PROCEDURES:  Repair of complex traction retinal detachment with pars plana vitrectomy, retinal photocoagulation, membrane peel, gas-fluid exchange, all in the left eye.  SURGEON:  Beulah Gandy. Ashley Royalty, MD  ASSISTANT:  Rosalie Doctor, SA.  ANESTHESIA:  General.  DETAILS:  After usual prep and drape, 25-gauge trocars placed at 10, 2, and 4 o'clock infusion at 4 o'clock.  Pars plana vitrectomy was begun just behind the cataractous lens.  Provisc was placed on the corneal surface, and the BIOM viewing system was moved into place.  The vitrectomy was carried down to the macular surface in a core fashion with blood vacuumed from the macular surface.  The disk and great arcades were elevated from vitreous traction.  The cutter was used to circumcise these areas of vitreous traction.  Then, they were trimmed down to the retinal surface.  The plug on the optic nerve was pulled with the automated forceps drivers.  A small amount of bleeding occurred at this point.  The vitrectomy was carried out into the mid periphery where blood was vacuumed from the retinal surface.  The vitrectomy was carried into the far periphery and the super wide viewing system was moved into place.  Scleral depression was used to gain access to the pars plana area.  Blood was removed under rapid cutting and minimal suction in this area.  The endolaser was positioned in the eye.  1136 burns were placed around the retinal periphery.  The power was 300 mW 1000 microns each and 0.1 seconds each.   Some laser was performed, then surface blood was removed until all blood was removed from the vitreous cavity and retinal surface.  A 100% gas-fluid exchange was then carried out.  Blood was vacuumed from the optic nerve area in macular region until the entire vitreous cavity was filled with gas.  The instruments were removed from the eye and 25-gauge trocars were removed one at a time.  The wounds were tested and held until they were secure. Polymyxin and gentamicin were irrigated into Tenon space and atropine solution was applied.  Marcaine was injected around the globe for postop pain and closing pressure was 10 with a Barraquer tonometer.  Decadron 10 mg was injected into the lower subconjunctival space.  Polysporin ophthalmic ointment, a patch and shield were placed.  The patient was awakened and taken to recovery in satisfactory condition.  COMPLICATIONS:  None.  DURATION:  One hour.     Beulah Gandy. Ashley Royalty, M.D.     JDM/MEDQ  D:  06/30/2013  T:  07/01/2013  Job:  [id number]

## 2013-07-01 NOTE — Progress Notes (Signed)
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  Discharge to home.  Give post operative instructions  Sherrie George 07/01/2013, 6:45 AM

## 2013-07-01 NOTE — Discharge Summary (Signed)
Discharge summary not needed on [facility] patients per medical records. 

## 2013-07-02 ENCOUNTER — Encounter (HOSPITAL_COMMUNITY): Payer: Self-pay | Admitting: Ophthalmology

## 2013-07-06 ENCOUNTER — Inpatient Hospital Stay (INDEPENDENT_AMBULATORY_CARE_PROVIDER_SITE_OTHER): Payer: Medicare Other | Admitting: Ophthalmology

## 2013-07-06 DIAGNOSIS — H35109 Retinopathy of prematurity, unspecified, unspecified eye: Secondary | ICD-10-CM

## 2013-07-06 DIAGNOSIS — E1139 Type 2 diabetes mellitus with other diabetic ophthalmic complication: Secondary | ICD-10-CM

## 2013-07-07 ENCOUNTER — Inpatient Hospital Stay (INDEPENDENT_AMBULATORY_CARE_PROVIDER_SITE_OTHER): Payer: Medicare Other | Admitting: Ophthalmology

## 2013-08-04 ENCOUNTER — Encounter (INDEPENDENT_AMBULATORY_CARE_PROVIDER_SITE_OTHER): Payer: Medicare Other | Admitting: Ophthalmology

## 2013-08-04 DIAGNOSIS — E1139 Type 2 diabetes mellitus with other diabetic ophthalmic complication: Secondary | ICD-10-CM

## 2013-08-04 DIAGNOSIS — E11359 Type 2 diabetes mellitus with proliferative diabetic retinopathy without macular edema: Secondary | ICD-10-CM

## 2013-08-04 DIAGNOSIS — H4310 Vitreous hemorrhage, unspecified eye: Secondary | ICD-10-CM

## 2013-08-04 DIAGNOSIS — E1165 Type 2 diabetes mellitus with hyperglycemia: Secondary | ICD-10-CM

## 2013-10-05 ENCOUNTER — Encounter (INDEPENDENT_AMBULATORY_CARE_PROVIDER_SITE_OTHER): Payer: Medicare Other | Admitting: Ophthalmology

## 2013-11-13 HISTORY — PX: BELOW KNEE LEG AMPUTATION: SUR23

## 2014-01-07 ENCOUNTER — Encounter (INDEPENDENT_AMBULATORY_CARE_PROVIDER_SITE_OTHER): Payer: Medicare Other | Admitting: Ophthalmology

## 2014-01-07 DIAGNOSIS — H211X9 Other vascular disorders of iris and ciliary body, unspecified eye: Secondary | ICD-10-CM

## 2014-01-07 DIAGNOSIS — H4310 Vitreous hemorrhage, unspecified eye: Secondary | ICD-10-CM

## 2014-01-07 DIAGNOSIS — E1139 Type 2 diabetes mellitus with other diabetic ophthalmic complication: Secondary | ICD-10-CM | POA: Diagnosis present

## 2014-01-07 DIAGNOSIS — H35039 Hypertensive retinopathy, unspecified eye: Secondary | ICD-10-CM

## 2014-01-07 DIAGNOSIS — H2510 Age-related nuclear cataract, unspecified eye: Secondary | ICD-10-CM

## 2014-01-07 DIAGNOSIS — E113519 Type 2 diabetes mellitus with proliferative diabetic retinopathy with macular edema, unspecified eye: Secondary | ICD-10-CM | POA: Diagnosis present

## 2014-01-07 DIAGNOSIS — E11359 Type 2 diabetes mellitus with proliferative diabetic retinopathy without macular edema: Secondary | ICD-10-CM

## 2014-01-07 DIAGNOSIS — H43819 Vitreous degeneration, unspecified eye: Secondary | ICD-10-CM

## 2014-01-07 DIAGNOSIS — I10 Essential (primary) hypertension: Secondary | ICD-10-CM

## 2014-01-07 NOTE — H&P (Signed)
Danielle LamingKathy Cardona is an 53 y.o. female.   Chief Complaint:loss of vision right eye HPI: Severe proliferative diabetic retinopathy with vitreous hemorrhage right eye  Past Medical History  Diagnosis Date  . Tuberculosis   . Hypertension   . Depression   . GERD (gastroesophageal reflux disease)   . History of blood transfusion 2012    "when they amputated my right leg" (06/30/2013)  . Pneumonia 2012  . Exertional shortness of breath     "since amputation in 2012" (06/30/2013)  . Type II diabetes mellitus   . Fibromyalgia   . Charcot's joint of foot   . Acute renal failure 2012    ARF on Chronic was on dialysis for 3 weeks in 2012    Past Surgical History  Procedure Laterality Date  . Below knee leg amputation Right 2012  . Cesarean section  1998  . Pars plana vitrectomy Left 06/30/2013     COMPLEX REPAIR OF TRACTION RETINAL DETACHMENT/notes 06/30/2013  . Toe amputation Right 2010    "2" (06/30/2013)  . Tubal ligation  1998  . Cardiac catheterization  2012  . Pars plana vitrectomy Left 06/30/2013    Procedure: PARS PLANA VITRECTOMY WITH 25 GAUGE;  Surgeon: Sherrie GeorgeJohn D Matthews, MD;  [facility];  Service: Ophthalmology;  Laterality: Left;  COMPLEX REPAIR OF TRACTION RETINAL DETACHMENT  . Membrane peel Left 06/30/2013    Procedure: MEMBRANE PEEL;  Surgeon: Sherrie GeorgeJohn D Matthews, MD;  [facility];  Service: Ophthalmology;  Laterality: Left;  . Photocoagulation with laser Left 06/30/2013    Procedure: ENDOLASER;  Surgeon: Sherrie GeorgeJohn D Matthews, MD;  [facility];  Service: Ophthalmology;  Laterality: Left;  . Gas/fluid exchange Left 06/30/2013    Procedure: AIR/FLUID EXCHANGE;  Surgeon: Sherrie GeorgeJohn D Matthews, MD;  [facility];  Service: Ophthalmology;  Laterality: Left;    No family history on file. Social History:  reports that she has never smoked. She has never used smokeless tobacco. She reports that she does not drink alcohol or use illicit drugs.  Allergies: No Known Allergies  No  prescriptions prior to admission    Review of systems otherwise negative  There were no vitals taken for this visit.  Physical exam: Mental status: oriented x3. Eyes: See eye exam associated with this date of surgery in media tab.  Scanned in by scanning center Ears, Nose, Throat: within normal limits Neck: Within Normal limits General: within normal limits Chest: Within normal limits Breast: deferred Heart: Within normal limits Abdomen: Within normal limits GU: deferred Extremities: within normal limits Skin: within normal limits  Assessment/Plan Severe proliferative diabetic retinopathy with vitreous hemorrhage right eye [facility] Pars plana vitrectomy, laser treatment, gas injection right eye  Sherrie GeorgeMATTHEWS, JOHN D 01/07/2014, 5:37 PM

## 2014-01-20 ENCOUNTER — Encounter (HOSPITAL_COMMUNITY): Payer: Self-pay | Admitting: Pharmacy Technician

## 2014-01-25 ENCOUNTER — Encounter (HOSPITAL_COMMUNITY): Payer: Self-pay | Admitting: *Deleted

## 2014-01-25 MED ORDER — TROPICAMIDE 1 % OP SOLN: 1.0000 [drp] | OPHTHALMIC | Status: DC | PRN

## 2014-01-25 MED ORDER — CYCLOPENTOLATE HCL 1 % OP SOLN: 1.0000 [drp] | OPHTHALMIC | Status: DC | PRN

## 2014-01-25 MED ORDER — CEFAZOLIN SODIUM-DEXTROSE 2-3 GM-% IV SOLR
2.0000 g | INTRAVENOUS | Status: AC
  Administered 2014-01-26: 2 g via INTRAVENOUS
  Filled 2014-01-25: qty 50

## 2014-01-25 MED ORDER — GATIFLOXACIN 0.5 % OP SOLN: 1.0000 [drp] | OPHTHALMIC | Status: DC | PRN

## 2014-01-25 MED ORDER — PHENYLEPHRINE HCL 2.5 % OP SOLN: 1.0000 [drp] | OPHTHALMIC | Status: DC | PRN

## 2014-01-26 ENCOUNTER — Ambulatory Visit (HOSPITAL_COMMUNITY)
Admission: RE | Admit: 2014-01-26 | Discharge: 2014-01-27 | Disposition: A | Discharge status: Home / Self Care | Payer: Medicare Other | Source: Ambulatory Visit | Attending: Ophthalmology | Admitting: Ophthalmology

## 2014-01-26 ENCOUNTER — Encounter (HOSPITAL_COMMUNITY): Payer: Medicare Other | Admitting: Anesthesiology

## 2014-01-26 ENCOUNTER — Encounter (HOSPITAL_COMMUNITY)
Admission: RE | Disposition: A | Discharge status: Home / Self Care | Payer: Self-pay | Source: Ambulatory Visit | Attending: Ophthalmology

## 2014-01-26 ENCOUNTER — Encounter (HOSPITAL_COMMUNITY): Payer: Self-pay | Admitting: Anesthesiology

## 2014-01-26 ENCOUNTER — Ambulatory Visit (HOSPITAL_COMMUNITY): Payer: Medicare Other | Admitting: Anesthesiology

## 2014-01-26 DIAGNOSIS — Z794 Long term (current) use of insulin: Secondary | ICD-10-CM | POA: Insufficient documentation

## 2014-01-26 DIAGNOSIS — K219 Gastro-esophageal reflux disease without esophagitis: Secondary | ICD-10-CM | POA: Insufficient documentation

## 2014-01-26 DIAGNOSIS — E1139 Type 2 diabetes mellitus with other diabetic ophthalmic complication: Secondary | ICD-10-CM | POA: Insufficient documentation

## 2014-01-26 DIAGNOSIS — IMO0001 Reserved for inherently not codable concepts without codable children: Secondary | ICD-10-CM | POA: Diagnosis not present

## 2014-01-26 DIAGNOSIS — H211X9 Other vascular disorders of iris and ciliary body, unspecified eye: Secondary | ICD-10-CM | POA: Diagnosis not present

## 2014-01-26 DIAGNOSIS — E11359 Type 2 diabetes mellitus with proliferative diabetic retinopathy without macular edema: Secondary | ICD-10-CM | POA: Insufficient documentation

## 2014-01-26 DIAGNOSIS — I10 Essential (primary) hypertension: Secondary | ICD-10-CM | POA: Insufficient documentation

## 2014-01-26 DIAGNOSIS — H4310 Vitreous hemorrhage, unspecified eye: Secondary | ICD-10-CM | POA: Insufficient documentation

## 2014-01-26 DIAGNOSIS — G589 Mononeuropathy, unspecified: Secondary | ICD-10-CM | POA: Insufficient documentation

## 2014-01-26 DIAGNOSIS — F329 Major depressive disorder, single episode, unspecified: Secondary | ICD-10-CM | POA: Diagnosis not present

## 2014-01-26 DIAGNOSIS — H3340 Traction detachment of retina, unspecified eye: Secondary | ICD-10-CM | POA: Insufficient documentation

## 2014-01-26 DIAGNOSIS — F3289 Other specified depressive episodes: Secondary | ICD-10-CM | POA: Diagnosis not present

## 2014-01-26 DIAGNOSIS — E1039 Type 1 diabetes mellitus with other diabetic ophthalmic complication: Secondary | ICD-10-CM | POA: Diagnosis present

## 2014-01-26 DIAGNOSIS — E1065 Type 1 diabetes mellitus with hyperglycemia: Secondary | ICD-10-CM

## 2014-01-26 DIAGNOSIS — E11311 Type 2 diabetes mellitus with unspecified diabetic retinopathy with macular edema: Secondary | ICD-10-CM | POA: Insufficient documentation

## 2014-01-26 DIAGNOSIS — E113519 Type 2 diabetes mellitus with proliferative diabetic retinopathy with macular edema, unspecified eye: Secondary | ICD-10-CM | POA: Diagnosis present

## 2014-01-26 HISTORY — DX: Personal history of Methicillin resistant Staphylococcus aureus infection: Z86.14

## 2014-01-26 HISTORY — PX: REPAIR OF COMPLEX TRACTION RETINAL DETACHMENT: SHX6217

## 2014-01-26 HISTORY — PX: LASER PHOTO ABLATION: SHX5942

## 2014-01-26 HISTORY — PX: GAS/FLUID EXCHANGE: SHX5334

## 2014-01-26 HISTORY — PX: MEMBRANE PEEL: SHX5967

## 2014-01-26 HISTORY — PX: PARS PLANA VITRECTOMY: SHX2166

## 2014-01-26 LAB — BASIC METABOLIC PANEL
ANION GAP: 11 (ref 5–15)
BUN: 25 mg/dL — ABNORMAL HIGH (ref 6–23)
CALCIUM: 8.7 mg/dL (ref 8.4–10.5)
CO2: 29 mEq/L (ref 19–32)
CREATININE: 1.6 mg/dL — AB (ref 0.50–1.10)
Chloride: 95 mEq/L — ABNORMAL LOW (ref 96–112)
GFR calc Af Amer: 42 mL/min — ABNORMAL LOW (ref >90)
GFR, EST NON AFRICAN AMERICAN: 36 mL/min — AB (ref >90)
Glucose, Bld: 376 mg/dL — ABNORMAL HIGH (ref 70–99)
Potassium: 4.9 mEq/L (ref 3.7–5.3)
Sodium: 135 mEq/L — ABNORMAL LOW (ref 137–147)

## 2014-01-26 LAB — GLUCOSE, CAPILLARY
GLUCOSE-CAPILLARY: 82 mg/dL (ref 70–99)
GLUCOSE-CAPILLARY: 210 mg/dL — AB (ref 70–99)
Glucose-Capillary: 378 mg/dL — ABNORMAL HIGH (ref 70–99)
Glucose-Capillary: 231 mg/dL — ABNORMAL HIGH (ref 70–99)
Glucose-Capillary: 84 mg/dL (ref 70–99)
Glucose-Capillary: 159 mg/dL — ABNORMAL HIGH (ref 70–99)
Glucose-Capillary: 181 mg/dL — ABNORMAL HIGH (ref 70–99)
Glucose-Capillary: 229 mg/dL — ABNORMAL HIGH (ref 70–99)

## 2014-01-26 LAB — CBC
HCT: 38.9 % (ref 36.0–46.0)
Hemoglobin: 12.1 g/dL (ref 12.0–15.0)
MCH: 27.3 pg (ref 26.0–34.0)
MCHC: 31.1 g/dL (ref 30.0–36.0)
MCV: 87.8 fL (ref 78.0–100.0)
PLATELETS: 336 10*3/uL (ref 150–400)
RBC: 4.43 MIL/uL (ref 3.87–5.11)
RDW: 14.9 % (ref 11.5–15.5)
WBC: 5.8 10*3/uL (ref 4.0–10.5)

## 2014-01-26 LAB — SURGICAL PCR SCREEN
MRSA, PCR: POSITIVE — AB
Staphylococcus aureus: POSITIVE — AB

## 2014-01-26 LAB — HCG, SERUM, QUALITATIVE: Preg, Serum: NEGATIVE

## 2014-01-26 SURGERY — PARS PLANA VITRECTOMY WITH 25 GAUGE
Anesthesia: General | Site: Eye | Laterality: Right

## 2014-01-26 MED ORDER — PROPOFOL 10 MG/ML IV BOLUS
INTRAVENOUS | Status: DC | PRN
  Administered 2014-01-26: 160 mg via INTRAVENOUS

## 2014-01-26 MED ORDER — SULFAMETHOXAZOLE-TMP DS 800-160 MG PO TABS
1.0000 | ORAL_TABLET | Freq: Two times a day (BID) | ORAL | Status: DC
  Administered 2014-01-26 (×2): 1 via ORAL
  Filled 2014-01-26 (×4): qty 1

## 2014-01-26 MED ORDER — FENTANYL CITRATE 0.05 MG/ML IJ SOLN
INTRAMUSCULAR | Status: DC | PRN
Start: 2014-01-26 — End: 2014-01-26
  Administered 2014-01-26: 75 ug via INTRAVENOUS

## 2014-01-26 MED ORDER — INSULIN ASPART 100 UNIT/ML ~~LOC~~ SOLN
10.0000 [IU] | Freq: Once | SUBCUTANEOUS | Status: AC
  Administered 2014-01-26: 10 [IU] via SUBCUTANEOUS

## 2014-01-26 MED ORDER — DEXAMETHASONE SODIUM PHOSPHATE 10 MG/ML IJ SOLN
INTRAMUSCULAR | Status: AC
  Filled 2014-01-26: qty 1

## 2014-01-26 MED ORDER — LISINOPRIL 10 MG PO TABS
10.0000 mg | ORAL_TABLET | Freq: Every day | ORAL | Status: DC
  Filled 2014-01-26: qty 1

## 2014-01-26 MED ORDER — MIDAZOLAM HCL 2 MG/2ML IJ SOLN
INTRAMUSCULAR | Status: AC
  Filled 2014-01-26: qty 2

## 2014-01-26 MED ORDER — MAGNESIUM HYDROXIDE 400 MG/5ML PO SUSP: 15.0000 mL | Freq: Four times a day (QID) | ORAL | Status: DC | PRN

## 2014-01-26 MED ORDER — ACETAZOLAMIDE SODIUM 500 MG IJ SOLR
500.0000 mg | Freq: Once | INTRAMUSCULAR | Status: AC
  Administered 2014-01-27: 500 mg via INTRAVENOUS
  Filled 2014-01-26: qty 500

## 2014-01-26 MED ORDER — PHENYLEPHRINE HCL 10 MG/ML IJ SOLN
INTRAMUSCULAR | Status: DC | PRN
  Administered 2014-01-26: 80 ug via INTRAVENOUS
  Administered 2014-01-26 (×2): 120 ug via INTRAVENOUS

## 2014-01-26 MED ORDER — PANTOPRAZOLE SODIUM 40 MG PO TBEC
40.0000 mg | DELAYED_RELEASE_TABLET | Freq: Every day | ORAL | Status: DC
  Administered 2014-01-26: 40 mg via ORAL
  Filled 2014-01-26: qty 1

## 2014-01-26 MED ORDER — SODIUM CHLORIDE 0.9 % IJ SOLN
INTRAMUSCULAR | Status: AC
  Filled 2014-01-26: qty 10

## 2014-01-26 MED ORDER — SODIUM CHLORIDE 0.9 % IV SOLN
10.0000 mg | INTRAVENOUS | Status: DC | PRN
  Administered 2014-01-26: 15 ug/min via INTRAVENOUS

## 2014-01-26 MED ORDER — GENTAMICIN SULFATE 40 MG/ML IJ SOLN
INTRAMUSCULAR | Status: AC
Start: 2014-01-26 — End: 2014-01-26
  Filled 2014-01-26: qty 2

## 2014-01-26 MED ORDER — GABAPENTIN 300 MG PO CAPS
900.0000 mg | ORAL_CAPSULE | Freq: Three times a day (TID) | ORAL | Status: DC
  Administered 2014-01-26 (×2): 900 mg via ORAL
  Filled 2014-01-26 (×5): qty 3

## 2014-01-26 MED ORDER — INSULIN ASPART 100 UNIT/ML ~~LOC~~ SOLN
0.0000 [IU] | SUBCUTANEOUS | Status: DC
Start: 2014-01-26 — End: 2014-01-27
  Administered 2014-01-26: 5 [IU] via SUBCUTANEOUS
  Administered 2014-01-26: 3 [IU] via SUBCUTANEOUS
  Administered 2014-01-27: 8 [IU] via SUBCUTANEOUS
  Administered 2014-01-27: 11 [IU] via SUBCUTANEOUS

## 2014-01-26 MED ORDER — GLYCOPYRROLATE 0.2 MG/ML IJ SOLN
INTRAMUSCULAR | Status: DC | PRN
Start: 2014-01-26 — End: 2014-01-26
  Administered 2014-01-26: 0.4 mg via INTRAVENOUS

## 2014-01-26 MED ORDER — DEXAMETHASONE SODIUM PHOSPHATE 10 MG/ML IJ SOLN
INTRAMUSCULAR | Status: DC | PRN
  Administered 2014-01-26: 10 mg

## 2014-01-26 MED ORDER — BACITRACIN-POLYMYXIN B 500-10000 UNIT/GM OP OINT
TOPICAL_OINTMENT | OPHTHALMIC | Status: AC
  Filled 2014-01-26: qty 3.5

## 2014-01-26 MED ORDER — INSULIN ASPART 100 UNIT/ML ~~LOC~~ SOLN
SUBCUTANEOUS | Status: AC
  Administered 2014-01-26: 12 [IU] via INTRAVENOUS
  Filled 2014-01-26: qty 1

## 2014-01-26 MED ORDER — TEMAZEPAM 15 MG PO CAPS: 15.0000 mg | ORAL_CAPSULE | Freq: Every evening | ORAL | Status: DC | PRN

## 2014-01-26 MED ORDER — POLYMYXIN B SULFATE 500000 UNITS IJ SOLR
INTRAMUSCULAR | Status: AC
  Filled 2014-01-26: qty 1

## 2014-01-26 MED ORDER — HYDROMORPHONE HCL PF 1 MG/ML IJ SOLN
INTRAMUSCULAR | Status: AC
  Filled 2014-01-26: qty 1

## 2014-01-26 MED ORDER — SODIUM HYALURONATE 10 MG/ML IO SOLN
INTRAOCULAR | Status: DC | PRN
Start: 2014-01-26 — End: 2014-01-26
  Administered 2014-01-26: 0.85 mL via INTRAOCULAR

## 2014-01-26 MED ORDER — BUPIVACAINE HCL (PF) 0.75 % IJ SOLN
INTRAMUSCULAR | Status: DC | PRN
  Administered 2014-01-26: 10 mL

## 2014-01-26 MED ORDER — ATROPINE SULFATE 1 % OP SOLN
OPHTHALMIC | Status: AC
  Filled 2014-01-26: qty 2

## 2014-01-26 MED ORDER — TETRACAINE HCL 0.5 % OP SOLN
2.0000 [drp] | Freq: Once | OPHTHALMIC | Status: DC
  Filled 2014-01-26: qty 2

## 2014-01-26 MED ORDER — NEOSTIGMINE METHYLSULFATE 10 MG/10ML IV SOLN
INTRAVENOUS | Status: DC | PRN
  Administered 2014-01-26: 3 mg via INTRAVENOUS
  Administered 2014-01-26: 1 mg via INTRAVENOUS

## 2014-01-26 MED ORDER — MUPIROCIN 2 % EX OINT
TOPICAL_OINTMENT | CUTANEOUS | Status: AC
  Filled 2014-01-26: qty 22

## 2014-01-26 MED ORDER — FENTANYL CITRATE 0.05 MG/ML IJ SOLN
INTRAMUSCULAR | Status: AC
  Filled 2014-01-26: qty 5

## 2014-01-26 MED ORDER — ONDANSETRON HCL 4 MG/2ML IJ SOLN
4.0000 mg | Freq: Four times a day (QID) | INTRAMUSCULAR | Status: DC | PRN
Start: 2014-01-26 — End: 2014-01-27

## 2014-01-26 MED ORDER — INSULIN ASPART 100 UNIT/ML ~~LOC~~ SOLN
15.0000 [IU] | Freq: Three times a day (TID) | SUBCUTANEOUS | Status: DC
Start: 2014-01-26 — End: 2014-01-27
  Administered 2014-01-26: 15 [IU] via SUBCUTANEOUS

## 2014-01-26 MED ORDER — INSULIN DETEMIR 100 UNIT/ML ~~LOC~~ SOLN
52.0000 [IU] | Freq: Every day | SUBCUTANEOUS | Status: DC
  Administered 2014-01-26: 52 [IU] via SUBCUTANEOUS
  Filled 2014-01-26: qty 0.52

## 2014-01-26 MED ORDER — PHENYLEPHRINE HCL 2.5 % OP SOLN
1.0000 [drp] | OPHTHALMIC | Status: AC | PRN
  Administered 2014-01-26 (×3): 1 [drp] via OPHTHALMIC
  Filled 2014-01-26: qty 2

## 2014-01-26 MED ORDER — LIDOCAINE HCL (CARDIAC) 20 MG/ML IV SOLN
INTRAVENOUS | Status: AC
Start: 2014-01-26 — End: 2014-01-26
  Filled 2014-01-26: qty 5

## 2014-01-26 MED ORDER — PROPOFOL 10 MG/ML IV BOLUS
INTRAVENOUS | Status: AC
  Filled 2014-01-26: qty 20

## 2014-01-26 MED ORDER — LIDOCAINE HCL 4 % MT SOLN
OROMUCOSAL | Status: DC | PRN
  Administered 2014-01-26: 3 mL via TOPICAL

## 2014-01-26 MED ORDER — MORPHINE SULFATE 2 MG/ML IJ SOLN
1.0000 mg | INTRAMUSCULAR | Status: DC | PRN
  Administered 2014-01-27: 2 mg via INTRAVENOUS
  Filled 2014-01-26: qty 1

## 2014-01-26 MED ORDER — OXYCODONE HCL 5 MG PO TABS
20.0000 mg | ORAL_TABLET | Freq: Four times a day (QID) | ORAL | Status: DC
  Administered 2014-01-26: 20 mg via ORAL
  Filled 2014-01-26 (×6): qty 4

## 2014-01-26 MED ORDER — GATIFLOXACIN 0.5 % OP SOLN
1.0000 [drp] | OPHTHALMIC | Status: AC | PRN
  Administered 2014-01-26 (×3): 1 [drp] via OPHTHALMIC
  Filled 2014-01-26: qty 2.5

## 2014-01-26 MED ORDER — BSS PLUS IO SOLN
INTRAOCULAR | Status: AC
  Filled 2014-01-26: qty 500

## 2014-01-26 MED ORDER — ASPIRIN EC 81 MG PO TBEC
81.0000 mg | DELAYED_RELEASE_TABLET | Freq: Every day | ORAL | Status: DC
  Administered 2014-01-26: 81 mg via ORAL
  Filled 2014-01-26 (×2): qty 1

## 2014-01-26 MED ORDER — POLYMYXIN B SULFATE 500000 UNITS IJ SOLR
INTRAMUSCULAR | Status: DC | PRN
  Administered 2014-01-26: 11:00:00

## 2014-01-26 MED ORDER — ACETAMINOPHEN 325 MG PO TABS: 325.0000 mg | ORAL_TABLET | ORAL | Status: DC | PRN

## 2014-01-26 MED ORDER — GABAPENTIN 600 MG PO TABS: 900.0000 mg | ORAL_TABLET | Freq: Three times a day (TID) | ORAL | Status: DC

## 2014-01-26 MED ORDER — HYDROMORPHONE HCL PF 1 MG/ML IJ SOLN
0.2500 mg | INTRAMUSCULAR | Status: DC | PRN
  Administered 2014-01-26 (×2): 0.5 mg via INTRAVENOUS

## 2014-01-26 MED ORDER — 0.9 % SODIUM CHLORIDE (POUR BTL) OPTIME
TOPICAL | Status: DC | PRN
  Administered 2014-01-26: 1000 mL

## 2014-01-26 MED ORDER — LIDOCAINE HCL (CARDIAC) 20 MG/ML IV SOLN
INTRAVENOUS | Status: DC | PRN
  Administered 2014-01-26: 40 mg via INTRAVENOUS

## 2014-01-26 MED ORDER — PREDNISOLONE ACETATE 1 % OP SUSP
1.0000 [drp] | Freq: Four times a day (QID) | OPHTHALMIC | Status: DC
  Filled 2014-01-26: qty 1

## 2014-01-26 MED ORDER — EPINEPHRINE HCL 1 MG/ML IJ SOLN
INTRAOCULAR | Status: DC | PRN
  Administered 2014-01-26: 11:00:00

## 2014-01-26 MED ORDER — TROPICAMIDE 1 % OP SOLN
1.0000 [drp] | OPHTHALMIC | Status: AC | PRN
  Administered 2014-01-26 (×3): 1 [drp] via OPHTHALMIC
  Filled 2014-01-26: qty 3

## 2014-01-26 MED ORDER — BACITRACIN-POLYMYXIN B 500-10000 UNIT/GM OP OINT
TOPICAL_OINTMENT | OPHTHALMIC | Status: DC | PRN
  Administered 2014-01-26: 1 via OPHTHALMIC

## 2014-01-26 MED ORDER — MUPIROCIN 2 % EX OINT
TOPICAL_OINTMENT | Freq: Two times a day (BID) | CUTANEOUS | Status: DC
  Administered 2014-01-26: 11:00:00 via NASAL
  Filled 2014-01-26: qty 22

## 2014-01-26 MED ORDER — TRAZODONE HCL 50 MG PO TABS
50.0000 mg | ORAL_TABLET | Freq: Every day | ORAL | Status: DC
  Administered 2014-01-26: 100 mg via ORAL
  Filled 2014-01-26 (×2): qty 2

## 2014-01-26 MED ORDER — EPINEPHRINE HCL 1 MG/ML IJ SOLN
INTRAMUSCULAR | Status: AC
  Filled 2014-01-26: qty 1

## 2014-01-26 MED ORDER — SODIUM HYALURONATE 10 MG/ML IO SOLN
INTRAOCULAR | Status: AC
  Filled 2014-01-26: qty 0.85

## 2014-01-26 MED ORDER — FENTANYL 100 MCG/HR TD PT72: 100.0000 ug | MEDICATED_PATCH | TRANSDERMAL | Status: DC

## 2014-01-26 MED ORDER — INSULIN ASPART 100 UNIT/ML ~~LOC~~ SOLN
12.0000 [IU] | Freq: Once | SUBCUTANEOUS | Status: AC
  Administered 2014-01-26: 12 [IU] via INTRAVENOUS

## 2014-01-26 MED ORDER — GATIFLOXACIN 0.5 % OP SOLN
1.0000 [drp] | Freq: Four times a day (QID) | OPHTHALMIC | Status: DC
  Filled 2014-01-26: qty 2.5

## 2014-01-26 MED ORDER — BRIMONIDINE TARTRATE 0.2 % OP SOLN
1.0000 [drp] | Freq: Two times a day (BID) | OPHTHALMIC | Status: DC
  Filled 2014-01-26: qty 5

## 2014-01-26 MED ORDER — SODIUM CHLORIDE 0.45 % IV SOLN
INTRAVENOUS | Status: DC
  Administered 2014-01-26: 14:00:00 via INTRAVENOUS

## 2014-01-26 MED ORDER — DOCUSATE SODIUM 100 MG PO CAPS
100.0000 mg | ORAL_CAPSULE | Freq: Two times a day (BID) | ORAL | Status: DC
  Administered 2014-01-26 (×2): 100 mg via ORAL
  Filled 2014-01-26 (×2): qty 1

## 2014-01-26 MED ORDER — ONDANSETRON HCL 4 MG/2ML IJ SOLN
INTRAMUSCULAR | Status: DC | PRN
  Administered 2014-01-26: 4 mg via INTRAVENOUS

## 2014-01-26 MED ORDER — LATANOPROST 0.005 % OP SOLN
1.0000 [drp] | Freq: Every day | OPHTHALMIC | Status: DC
  Filled 2014-01-26: qty 2.5

## 2014-01-26 MED ORDER — HYDROCODONE-ACETAMINOPHEN 5-325 MG PO TABS
1.0000 | ORAL_TABLET | ORAL | Status: DC | PRN
  Administered 2014-01-26 – 2014-01-27 (×3): 2 via ORAL
  Filled 2014-01-26 (×3): qty 2

## 2014-01-26 MED ORDER — BUPIVACAINE HCL (PF) 0.75 % IJ SOLN
INTRAMUSCULAR | Status: AC
  Filled 2014-01-26: qty 10

## 2014-01-26 MED ORDER — ONDANSETRON HCL 4 MG/2ML IJ SOLN: 4.0000 mg | Freq: Once | INTRAMUSCULAR | Status: DC | PRN

## 2014-01-26 MED ORDER — DULOXETINE HCL 60 MG PO CPEP
60.0000 mg | ORAL_CAPSULE | Freq: Two times a day (BID) | ORAL | Status: DC
Start: 2014-01-26 — End: 2014-01-27
  Administered 2014-01-26 (×2): 60 mg via ORAL
  Filled 2014-01-26 (×4): qty 1

## 2014-01-26 MED ORDER — BACITRACIN-POLYMYXIN B 500-10000 UNIT/GM OP OINT
1.0000 "application " | TOPICAL_OINTMENT | Freq: Four times a day (QID) | OPHTHALMIC | Status: DC
  Filled 2014-01-26: qty 3.5

## 2014-01-26 MED ORDER — CYCLOPENTOLATE HCL 1 % OP SOLN
1.0000 [drp] | OPHTHALMIC | Status: AC | PRN
  Administered 2014-01-26 (×3): 1 [drp] via OPHTHALMIC
  Filled 2014-01-26: qty 2

## 2014-01-26 MED ORDER — ROCURONIUM BROMIDE 100 MG/10ML IV SOLN
INTRAVENOUS | Status: DC | PRN
  Administered 2014-01-26: 30 mg via INTRAVENOUS

## 2014-01-26 MED ORDER — SODIUM CHLORIDE 0.9 % IV SOLN
INTRAVENOUS | Status: DC
  Administered 2014-01-26 (×2): via INTRAVENOUS

## 2014-01-26 SURGICAL SUPPLY — 42 items
CANNULA VLV SOFT TIP 25GA (OPHTHALMIC) ×2 IMPLANT
CORDS BIPOLAR (ELECTRODE) ×2 IMPLANT
COTTONBALL LRG STERILE PKG (GAUZE/BANDAGES/DRESSINGS) ×6 IMPLANT
COVER MAYO STAND STRL (DRAPES) ×2 IMPLANT
DRAPE INCISE 51X51 W/FILM STRL (DRAPES) ×2 IMPLANT
DRAPE OPHTHALMIC 77X100 STRL (CUSTOM PROCEDURE TRAY) ×2 IMPLANT
GLOVE SS BIOGEL STRL SZ 6.5 (GLOVE) ×1 IMPLANT
GLOVE SS BIOGEL STRL SZ 7 (GLOVE) ×1 IMPLANT
GLOVE SS BIOGEL STRL SZ 8 (GLOVE) ×1 IMPLANT
GLOVE SUPERSENSE BIOGEL SZ 6.5 (GLOVE) ×1
GLOVE SUPERSENSE BIOGEL SZ 7 (GLOVE) ×1
GLOVE SUPERSENSE BIOGEL SZ 8 (GLOVE) ×1
GLOVE SURG 8.5 LATEX PF (GLOVE) ×2 IMPLANT
GOWN ISOLATION IMPERV (GOWNS) ×2 IMPLANT
GOWN STRL REUS W/ TWL LRG LVL3 (GOWN DISPOSABLE) ×4 IMPLANT
GOWN STRL REUS W/TWL LRG LVL3 (GOWN DISPOSABLE) ×4
HANDLE PNEUMATIC FOR CONSTEL (OPHTHALMIC) IMPLANT
KIT BASIN OR (CUSTOM PROCEDURE TRAY) ×2 IMPLANT
MICROPICK 25G (MISCELLANEOUS)
NEEDLE 18GX1X1/2 (RX/OR ONLY) (NEEDLE) ×2 IMPLANT
NEEDLE 25GX 5/8IN NON SAFETY (NEEDLE) ×2 IMPLANT
NEEDLE FILTER BLUNT 18X 1/2SAF (NEEDLE) ×1
NEEDLE FILTER BLUNT 18X1 1/2 (NEEDLE) ×1 IMPLANT
NEEDLE HYPO 30X.5 LL (NEEDLE) ×2 IMPLANT
NS IRRIG 1000ML POUR BTL (IV SOLUTION) ×2 IMPLANT
PACK VITRECTOMY CUSTOM (CUSTOM PROCEDURE TRAY) ×2 IMPLANT
PAD ARMBOARD 7.5X6 YLW CONV (MISCELLANEOUS) ×4 IMPLANT
PAK PIK VITRECTOMY CVS 25GA (OPHTHALMIC) ×2 IMPLANT
PENCIL BIPOLAR 25GA STR DISP (OPHTHALMIC RELATED) ×2 IMPLANT
PIC ILLUMINATED 25G (OPHTHALMIC) ×2
PICK MICROPICK 25G (MISCELLANEOUS) IMPLANT
PIK ILLUMINATED 25G (OPHTHALMIC) ×1 IMPLANT
PROBE LASER ILLUM FLEX CVD 25G (OPHTHALMIC) ×2 IMPLANT
ROLLS DENTAL (MISCELLANEOUS) ×4 IMPLANT
SCRAPER DIAMOND DUST MEMBRANE (MISCELLANEOUS) ×2 IMPLANT
SPONGE SURGIFOAM ABS GEL 12-7 (HEMOSTASIS) ×2 IMPLANT
SYR 20CC LL (SYRINGE) ×2 IMPLANT
SYR BULB 3OZ (MISCELLANEOUS) ×2 IMPLANT
SYR TB 1ML LUER SLIP (SYRINGE) ×2 IMPLANT
TOWEL OR 17X24 6PK STRL BLUE (TOWEL DISPOSABLE) ×6 IMPLANT
WATER STERILE IRR 1000ML POUR (IV SOLUTION) ×2 IMPLANT
WIPE INSTRUMENT VISIWIPE 73X73 (MISCELLANEOUS) ×2 IMPLANT

## 2014-01-26 NOTE — Progress Notes (Signed)
Pt's CBG=378. Dr. Katrinka BlazingSmith called and informed with orders for Novolog 12 units IV and 10 units IM. Dr. Katrinka BlazingSmith at bedside at 1000 and also made aware of low blood pressure (80s/50s). Pt remains alert and oriented with no reports of dizziness or lightheadedness, will monitor.  Unable to place TEDS due to bilateral amputations.

## 2014-01-26 NOTE — Anesthesia Procedure Notes (Signed)
Procedure Name: Intubation Date/Time: 01/26/2014 11:47 AM Performed by: Quentin OreWALKER, Adanna Zuckerman E Pre-anesthesia Checklist: Patient identified, Emergency Drugs available, Suction available, Patient being monitored and Timeout performed Patient Re-evaluated:Patient Re-evaluated prior to inductionOxygen Delivery Method: Circle system utilized Preoxygenation: Pre-oxygenation with 100% oxygen Intubation Type: IV induction Ventilation: Mask ventilation without difficulty Laryngoscope Size: Mac and 3 Grade View: Grade I Tube type: Oral Tube size: 7.0 mm Number of attempts: 1 Airway Equipment and Method: Stylet Placement Confirmation: ETT inserted through vocal cords under direct vision,  positive ETCO2 and breath sounds checked- equal and bilateral Secured at: 21 cm Tube secured with: Tape Dental Injury: Teeth and Oropharynx as per pre-operative assessment

## 2014-01-26 NOTE — Anesthesia Postprocedure Evaluation (Signed)
  Anesthesia Post-op Note  Patient: Danielle Ingram  Procedure(s) Performed: Procedure(s): PARS PLANA VITRECTOMY WITH 25 GAUGE (Right) MEMBRANE PEEL (Right) LASER PHOTO ABLATION (Right) AIR/FLUID EXCHANGE (Right) REPAIR OF COMPLEX TRACTION RETINAL DETACHMENT (Right)  Patient Location: PACU  Anesthesia Type:General  Level of Consciousness: awake, alert , oriented and patient cooperative  Airway and Oxygen Therapy: Patient Spontanous Breathing  Post-op Pain: mild  Post-op Assessment: Post-op Vital signs reviewed, Patient's Cardiovascular Status Stable, Respiratory Function Stable, Patent Airway and No signs of Nausea or vomiting  Post-op Vital Signs: stable  Last Vitals:  Filed Vitals:   01/26/14 1320  BP: 100/52  Pulse: 92  Temp:   Resp: 15    Complications: No apparent anesthesia complications

## 2014-01-26 NOTE — Brief Op Note (Signed)
01/26/2014  12:39 PM  PATIENT:  Danielle Ingram  53 y.o. female  PRE-OPERATIVE DIAGNOSIS:  Vitreous hemorrhage right eye  POST-OPERATIVE DIAGNOSIS:  Vitreous hemorrhage right eye  PROCEDURE:  Procedure(s): PARS PLANA VITRECTOMY WITH 25 GAUGE (Right) MEMBRANE PEEL (Right) LASER PHOTO ABLATION (Right) AIR/FLUID EXCHANGE (Right) REPAIR OF COMPLEX TRACTION RETINAL DETACHMENT (Right)  SURGEON:  Surgeon(s) and Role:    * Sherrie GeorgeJohn D Matthews, MD - Primary  Brief Operative note   Preoperative diagnosis:  Vitreous hemorrhage right eye Postoperative diagnosis  Post-Op Diagnosis Codes:    * Vitreous hemorrhage, right [379.23]  Procedures: Repair of complex traction retinal detachment right eye  Surgeon:  Sherrie GeorgeJohn D Matthews, MD...  Assistant:  Rosalie DoctorLisa Johnson SA    Anesthesia: General  Specimen: none  Estimated blood loss:  1cc  Complications: none  Patient sent to PACU in good condition  Composed by Sherrie GeorgeJohn D Matthews MD  Dictation number: [id number]

## 2014-01-26 NOTE — Transfer of Care (Signed)
Immediate Anesthesia Transfer of Care Note  Patient: Danielle Ingram  Procedure(s) Performed: Procedure(s): PARS PLANA VITRECTOMY WITH 25 GAUGE (Right) MEMBRANE PEEL (Right) LASER PHOTO ABLATION (Right) AIR/FLUID EXCHANGE (Right) REPAIR OF COMPLEX TRACTION RETINAL DETACHMENT (Right)  Patient Location: PACU  Anesthesia Type:General  Level of Consciousness: awake, alert  and oriented  Airway & Oxygen Therapy: Patient Spontanous Breathing and Patient connected to nasal cannula oxygen  Post-op Assessment: Report given to PACU RN and Post -op Vital signs reviewed and stable  Post vital signs: Reviewed and stable  Complications: No apparent anesthesia complications

## 2014-01-26 NOTE — Progress Notes (Signed)
Dr. Katrinka BlazingSmith back by to check on pt. Informed of CBG=231 and BP 82/57. Increased flow of NS per MD order. 1105 BP increased to 95/62. Pt reports not getting any sleep last night and feeling tired but continues to deny dizziness or lightheadedness.

## 2014-01-26 NOTE — H&P (Signed)
I examined the patient today and there is no change in the medical statusI examined the patient today and there is no change in the medical status 

## 2014-01-26 NOTE — Anesthesia Preprocedure Evaluation (Addendum)
Anesthesia Evaluation  Patient identified by MRN, date of birth, ID band Patient awake    Reviewed: Allergy & Precautions, H&P , NPO status , Patient's Chart, lab work & pertinent test results  Airway Mallampati: I      Dental  (+) Edentulous Upper   Pulmonary          Cardiovascular hypertension,     Neuro/Psych  Neuromuscular disease    GI/Hepatic GERD-  ,  Endo/Other  diabetes, Type 1, Insulin Dependent, Oral Hypoglycemic Agents  Renal/GU Renal InsufficiencyRenal disease     Musculoskeletal   Abdominal   Peds  Hematology   Anesthesia Other Findings   Reproductive/Obstetrics                          Anesthesia Physical Anesthesia Plan  ASA: III  Anesthesia Plan: General   Post-op Pain Management:    Induction: Intravenous  Airway Management Planned: Oral ETT  Additional Equipment:   Intra-op Plan:   Post-operative Plan: Extubation in OR  Informed Consent: I have reviewed the patients History and Physical, chart, labs and discussed the procedure including the risks, benefits and alternatives for the proposed anesthesia with the patient or authorized representative who has indicated his/her understanding and acceptance.     Plan Discussed with:   Anesthesia Plan Comments:         Anesthesia Quick Evaluation

## 2014-01-27 DIAGNOSIS — E1139 Type 2 diabetes mellitus with other diabetic ophthalmic complication: Secondary | ICD-10-CM | POA: Diagnosis not present

## 2014-01-27 LAB — GLUCOSE, CAPILLARY
GLUCOSE-CAPILLARY: 279 mg/dL — AB (ref 70–99)
Glucose-Capillary: 321 mg/dL — ABNORMAL HIGH (ref 70–99)

## 2014-01-27 MED ORDER — PREDNISOLONE ACETATE 1 % OP SUSP: 1.0000 [drp] | Freq: Four times a day (QID) | OPHTHALMIC | Status: AC

## 2014-01-27 MED ORDER — BACITRACIN-POLYMYXIN B 500-10000 UNIT/GM OP OINT: 1.0000 "application " | TOPICAL_OINTMENT | Freq: Three times a day (TID) | OPHTHALMIC | Status: AC

## 2014-01-27 MED ORDER — GATIFLOXACIN 0.5 % OP SOLN: 1.0000 [drp] | Freq: Four times a day (QID) | OPHTHALMIC | Status: AC

## 2014-01-27 NOTE — Op Note (Signed)
NAMClydene Laming:  Ingram, Danielle              ACCOUNT NO.:  [id number]  MEDICAL RECORD NO.:  [id number]  LOCATION:  6N06C                        FACILITY:  [facility]  PHYSICIAN:  Beulah GandyJohn D. Ashley RoyaltyMatthews, M.D. DATE OF BIRTH:  1960-09-17  DATE OF PROCEDURE:  01/26/2014 DATE OF DISCHARGE:                              OPERATIVE REPORT   ADMISSION DIAGNOSIS:  Complex traction retinal detachment, vitreous hemorrhage, and proliferative diabetic retinopathy; right eye.  PROCEDURES:  Repair of complex traction retinal detachment with pars plana vitrectomy, retinal photocoagulation, gas fluid exchange, membrane peel; all in the right eye.  SURGEON:  Beulah GandyJohn D. Ashley RoyaltyMatthews, M.D.  ASSISTANT:  Rosalie DoctorLisa Johnson, SA.  ANESTHESIA:  General.  DETAILS:  After usual prep and drape, 25-gauge trocars placed at 8, 10, and 2 o'clock with infusion at 8 o'clock.  Pars plana vitrectomy was begun just behind the cataractous lens.  The anterior vitreous was filled with blood.  This was carefully removed under low suction and rapid cutting.  The vitrectomy is carried into the mid vitreous in a core fashion down toward the macular surface.  Additional blood and vitreous strands were encountered.  There were multiple adhesions to the macular region within the arcade and outside the arcades.  These were each trimmed with the vitreous cutter.  An endolaser was used for around the areas of traction detachment.  The vitrectomy was carried down to the macular surface where blood was vacuumed from the retinal surface. The vitrectomy was carried into the mid periphery of the eye where surface proliferation was seen.  This was carefully removed under low suction and rapid cutting for 360 degrees.  All membranes were removed. The vitrectomy was carried into the far periphery where the extra wide BIOM viewing system was moved into place.  Scleral depression was also used to gain access to the vitreous base.  All blood was removed from these areas.   The endolaser was positioned in the eye, and a total of 805 burns were placed around the retinal periphery.  The power was 400 mW, 1000 microns each and 0.1 seconds each.  The 100% gas fluid exchange was then carried out.  The instruments were removed from the eye.  The trocars were removed from the eye.  The wounds were tested and found to be secure.  Polymyxin and gentamicin were irrigated into tenon space. Atropine solution was applied.  Decadron 10 mg was injected to the lower subconjunctival space.  Marcaine was injected around the globe for postop pain.  Closing pressure was 10 with a Barraquer  tonometer. Polysporin ophthalmic ointment, a patch and shield were placed.  The patient was awakened, taken to recovery in satisfactory condition.  COMPLICATIONS:  None.  DURATION:  1 hour.     Beulah GandyJohn D. Ashley RoyaltyMatthews, M.D.     JDM/MEDQ  D:  01/26/2014  T:  01/27/2014  Job:  [id number]

## 2014-01-27 NOTE — Discharge Summary (Signed)
Discharge summary not needed on [facility] patients per medical records. 

## 2014-01-27 NOTE — Progress Notes (Addendum)
[id number] Patient given discharge instructions. Patient verbalize understanding of instructions. Patient discharge to home.

## 2014-01-27 NOTE — Progress Notes (Signed)
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  Discharge to home.  Give post operative instructions  Sherrie GeorgeMATTHEWS, JOHN D 01/27/2014, 6:42 AM

## 2014-01-29 ENCOUNTER — Encounter (HOSPITAL_COMMUNITY): Payer: Self-pay | Admitting: Ophthalmology

## 2014-02-02 ENCOUNTER — Inpatient Hospital Stay (INDEPENDENT_AMBULATORY_CARE_PROVIDER_SITE_OTHER): Payer: Medicare Other | Admitting: Ophthalmology

## 2014-02-03 ENCOUNTER — Inpatient Hospital Stay (INDEPENDENT_AMBULATORY_CARE_PROVIDER_SITE_OTHER): Payer: Medicare Other | Admitting: Ophthalmology

## 2014-02-03 DIAGNOSIS — E11359 Type 2 diabetes mellitus with proliferative diabetic retinopathy without macular edema: Secondary | ICD-10-CM

## 2014-02-03 DIAGNOSIS — E1165 Type 2 diabetes mellitus with hyperglycemia: Secondary | ICD-10-CM

## 2014-02-03 DIAGNOSIS — E1139 Type 2 diabetes mellitus with other diabetic ophthalmic complication: Secondary | ICD-10-CM

## 2014-02-26 ENCOUNTER — Encounter (INDEPENDENT_AMBULATORY_CARE_PROVIDER_SITE_OTHER): Payer: Medicare Other | Admitting: Ophthalmology

## 2014-02-26 DIAGNOSIS — E1039 Type 1 diabetes mellitus with other diabetic ophthalmic complication: Secondary | ICD-10-CM

## 2014-02-26 DIAGNOSIS — E1065 Type 1 diabetes mellitus with hyperglycemia: Secondary | ICD-10-CM

## 2014-02-26 DIAGNOSIS — E11359 Type 2 diabetes mellitus with proliferative diabetic retinopathy without macular edema: Secondary | ICD-10-CM

## 2014-02-26 DIAGNOSIS — H4310 Vitreous hemorrhage, unspecified eye: Secondary | ICD-10-CM

## 2014-03-17 ENCOUNTER — Encounter (INDEPENDENT_AMBULATORY_CARE_PROVIDER_SITE_OTHER): Payer: Medicare Other | Admitting: Ophthalmology

## 2014-05-06 IMAGING — CR DG CHEST 2V
2 series · 2 of 2 positions shown · non-contrast
Comparison: None.

CLINICAL DATA: Preop evaluation.

EXAM:
CHEST  2 VIEW

[w chest lat]
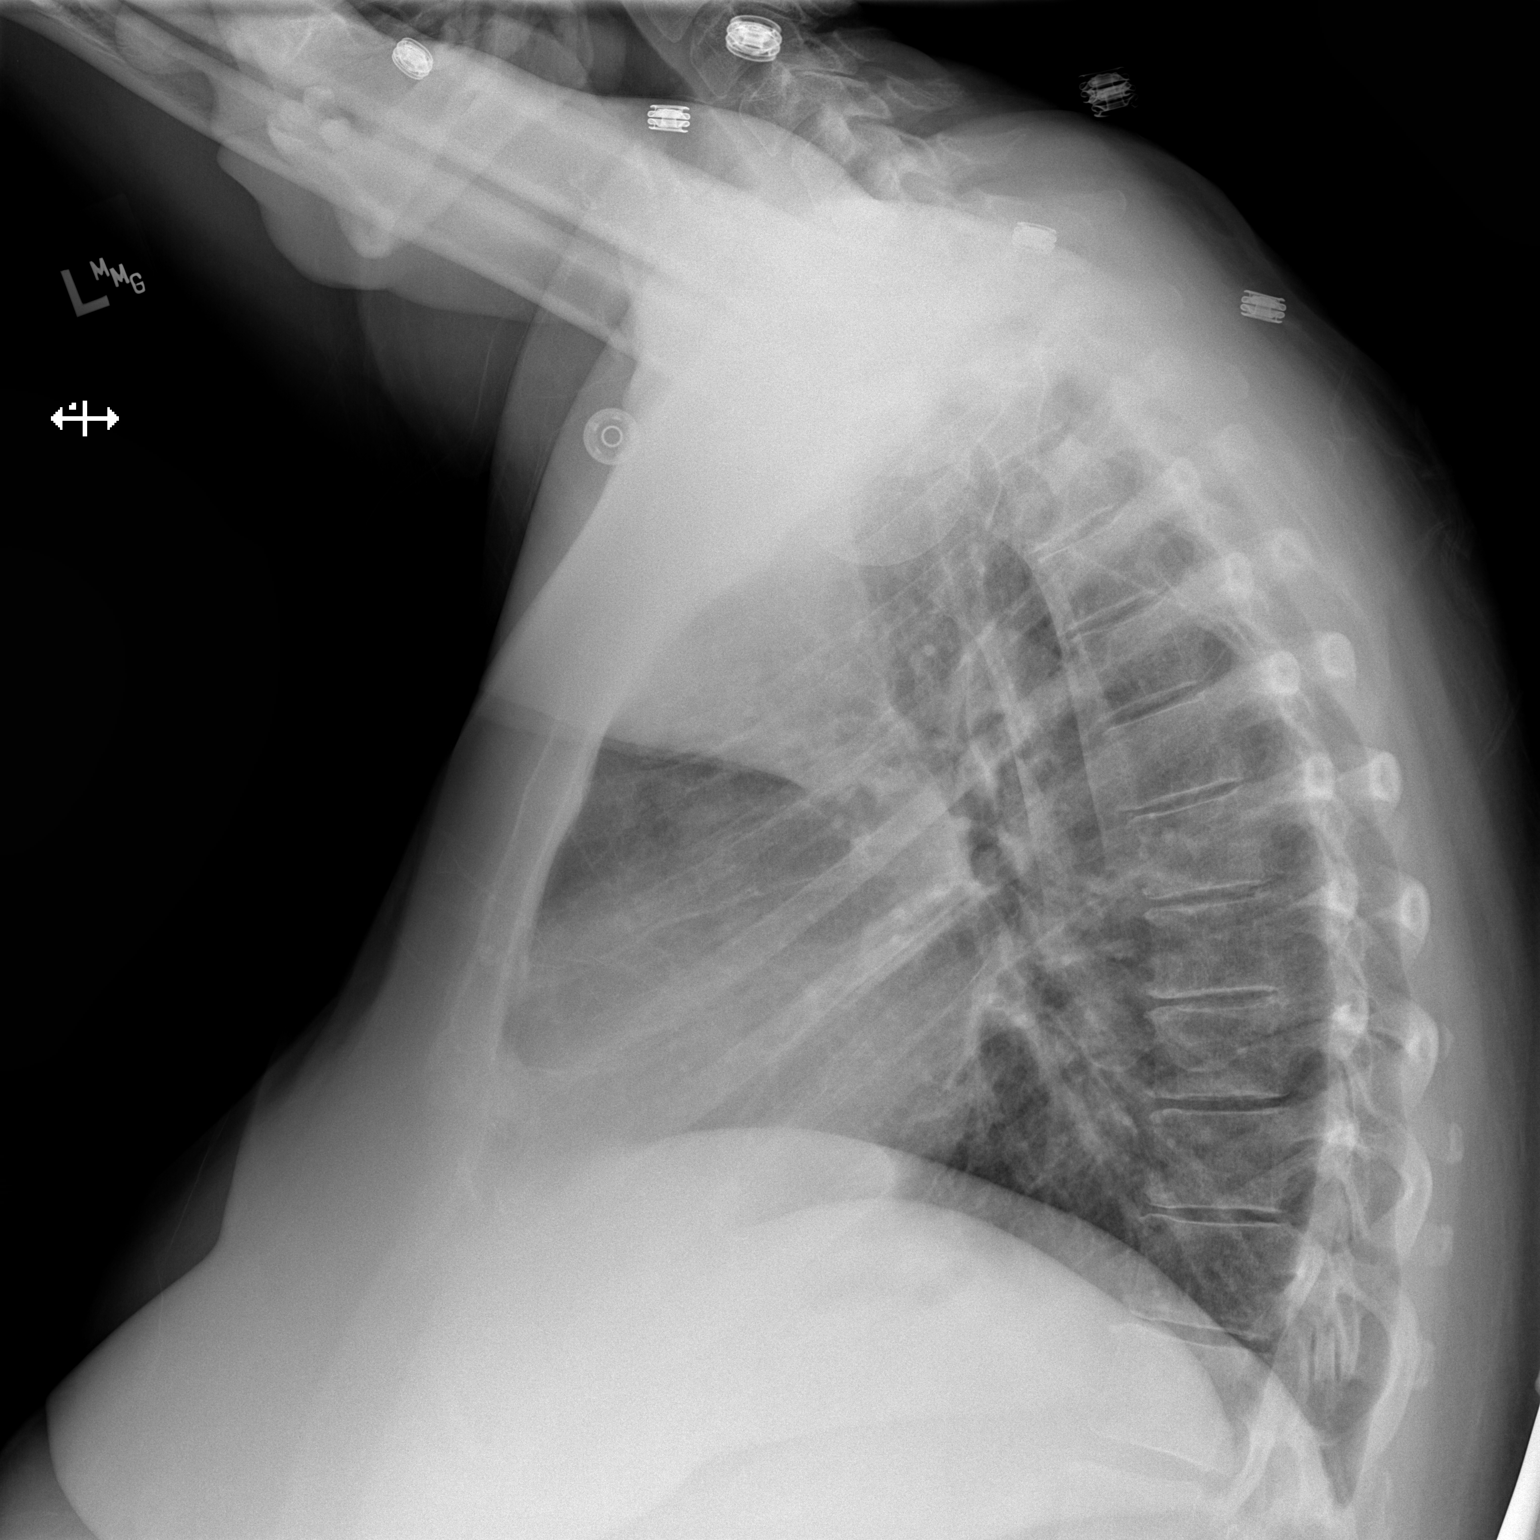

[x chest ap]
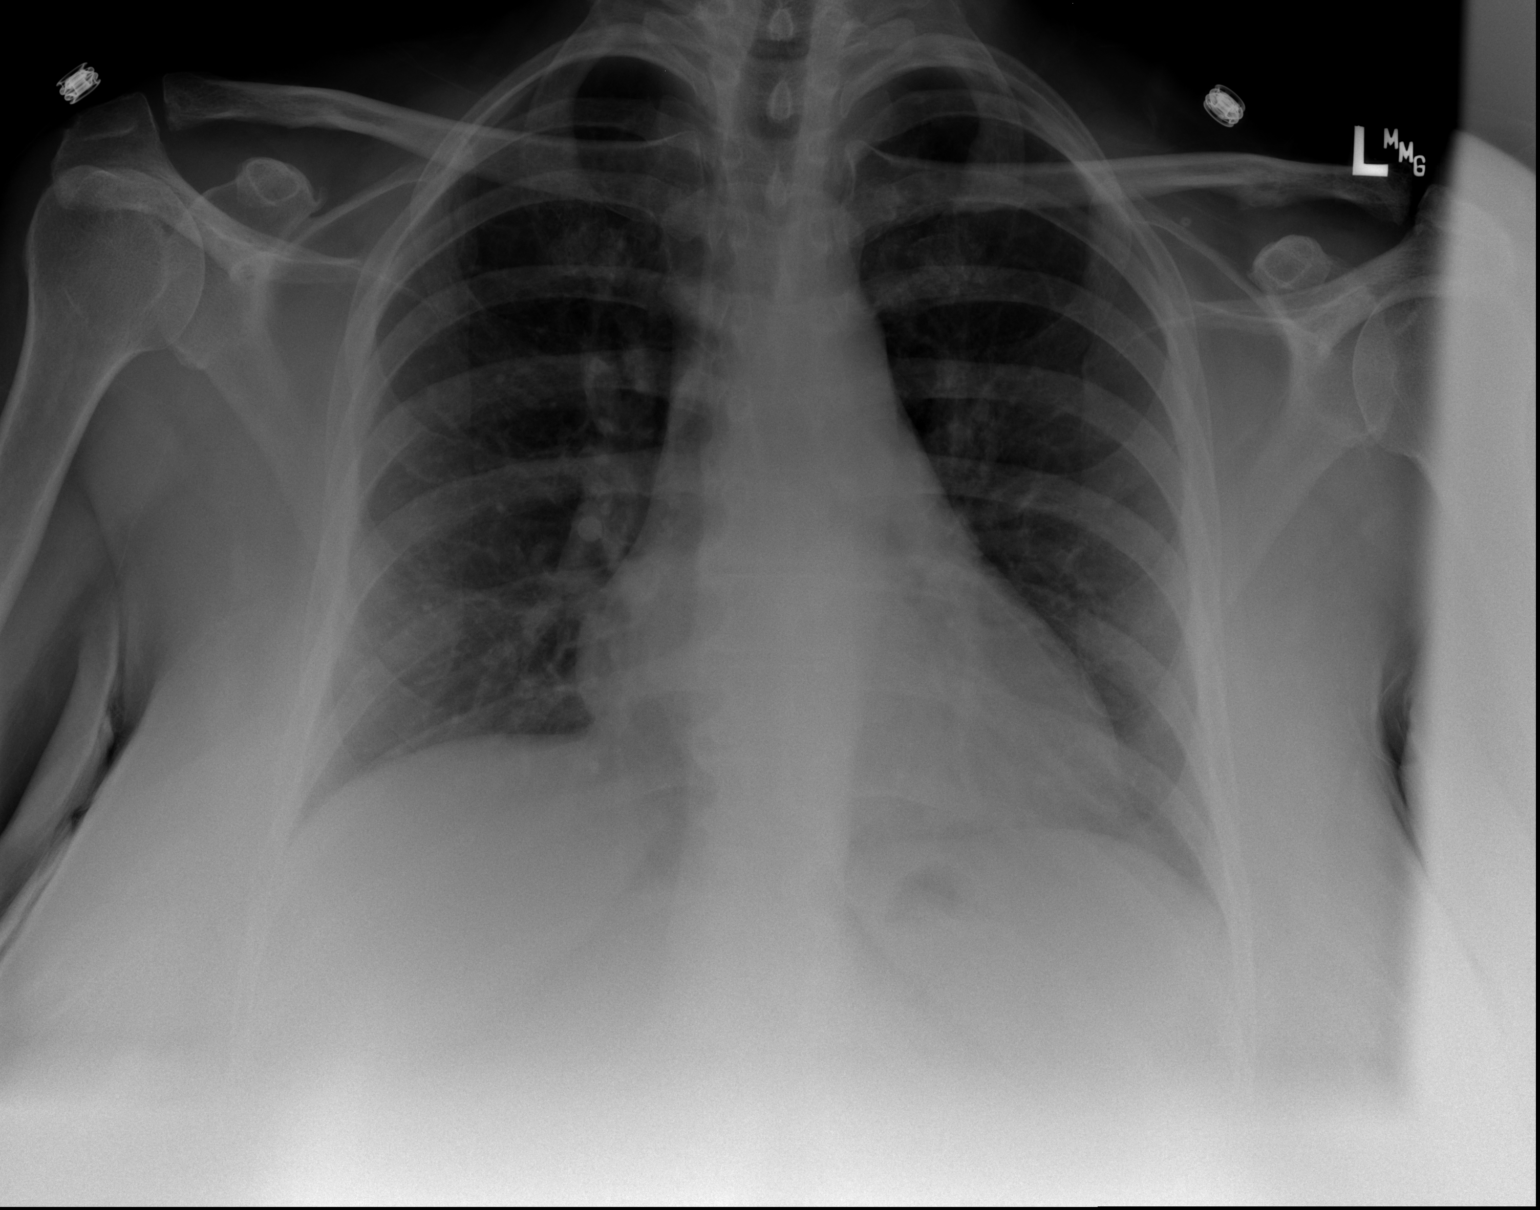

[2 of 2 positions shown; findings below may reference images not displayed]

FINDINGS: The heart size and mediastinal contours are within normal limits.
Both lungs are clear. The visualized skeletal structures are
unremarkable.
IMPRESSION: No active cardiopulmonary disease.

## 2014-05-07 ENCOUNTER — Encounter (INDEPENDENT_AMBULATORY_CARE_PROVIDER_SITE_OTHER): Payer: Medicare Other | Admitting: Ophthalmology
